# Patient Record
Sex: Male | Born: 1985 | Race: White | Hispanic: No | Marital: Single | Smoking: Current every day smoker
Health system: Southern US, Community
[De-identification: ages and names within clinical notes are randomized; demographics above are authoritative.]

## PROBLEM LIST (undated history)

## (undated) HISTORY — PX: ANKLE SURGERY: SHX546

## (undated) HISTORY — PX: PELVIC FRACTURE SURGERY: SHX119

---
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
## 2007-10-16 ENCOUNTER — Emergency Department (HOSPITAL_COMMUNITY): Admission: EM | Admit: 2007-10-16 | Discharge: 2007-10-16 | Payer: Self-pay | Admitting: Emergency Medicine

## 2007-12-25 ENCOUNTER — Emergency Department (HOSPITAL_COMMUNITY): Admission: EM | Admit: 2007-12-25 | Discharge: 2007-12-25 | Payer: Self-pay | Admitting: Emergency Medicine

## 2009-09-30 ENCOUNTER — Emergency Department (HOSPITAL_COMMUNITY): Admission: EM | Admit: 2009-09-30 | Discharge: 2009-09-30 | Payer: Self-pay | Admitting: Emergency Medicine

## 2010-04-01 ENCOUNTER — Ambulatory Visit: Payer: Self-pay | Admitting: Diagnostic Radiology

## 2010-04-01 ENCOUNTER — Emergency Department (HOSPITAL_BASED_OUTPATIENT_CLINIC_OR_DEPARTMENT_OTHER): Admission: EM | Admit: 2010-04-01 | Discharge: 2010-04-01 | Payer: Self-pay | Admitting: Emergency Medicine

## 2010-04-04 ENCOUNTER — Emergency Department (HOSPITAL_BASED_OUTPATIENT_CLINIC_OR_DEPARTMENT_OTHER): Admission: EM | Admit: 2010-04-04 | Discharge: 2010-04-04 | Payer: Self-pay | Admitting: Emergency Medicine

## 2010-10-12 ENCOUNTER — Emergency Department (HOSPITAL_BASED_OUTPATIENT_CLINIC_OR_DEPARTMENT_OTHER)
Admission: EM | Admit: 2010-10-12 | Discharge: 2010-10-12 | Disposition: A | Discharge status: Home / Self Care | Payer: Self-pay | Attending: Emergency Medicine | Admitting: Emergency Medicine

## 2010-10-12 ENCOUNTER — Emergency Department (INDEPENDENT_AMBULATORY_CARE_PROVIDER_SITE_OTHER): Payer: Self-pay

## 2010-10-12 DIAGNOSIS — S82899A Other fracture of unspecified lower leg, initial encounter for closed fracture: Secondary | ICD-10-CM

## 2010-10-12 DIAGNOSIS — F17200 Nicotine dependence, unspecified, uncomplicated: Secondary | ICD-10-CM | POA: Insufficient documentation

## 2010-10-12 DIAGNOSIS — X500XXA Overexertion from strenuous movement or load, initial encounter: Secondary | ICD-10-CM

## 2010-12-16 ENCOUNTER — Ambulatory Visit (HOSPITAL_COMMUNITY)
Admission: RE | Admit: 2010-12-16 | Discharge: 2010-12-16 | Disposition: A | Discharge status: Home / Self Care | Payer: Self-pay | Source: Ambulatory Visit | Attending: Specialist | Admitting: Specialist

## 2010-12-16 DIAGNOSIS — F17200 Nicotine dependence, unspecified, uncomplicated: Secondary | ICD-10-CM | POA: Insufficient documentation

## 2010-12-16 DIAGNOSIS — S8263XA Displaced fracture of lateral malleolus of unspecified fibula, initial encounter for closed fracture: Secondary | ICD-10-CM | POA: Insufficient documentation

## 2010-12-16 DIAGNOSIS — K219 Gastro-esophageal reflux disease without esophagitis: Secondary | ICD-10-CM | POA: Insufficient documentation

## 2010-12-16 DIAGNOSIS — Y92009 Unspecified place in unspecified non-institutional (private) residence as the place of occurrence of the external cause: Secondary | ICD-10-CM | POA: Insufficient documentation

## 2010-12-16 DIAGNOSIS — X58XXXA Exposure to other specified factors, initial encounter: Secondary | ICD-10-CM | POA: Insufficient documentation

## 2010-12-16 LAB — COMPREHENSIVE METABOLIC PANEL
ALT: 25 U/L (ref 0–53)
AST: 22 U/L (ref 0–37)
Albumin: 4.4 g/dL (ref 3.5–5.2)
Alkaline Phosphatase: 69 U/L (ref 39–117)
BUN: 8 mg/dL (ref 6–23)
Creatinine, Ser: 1.1 mg/dL (ref 0.4–1.5)
GFR calc Af Amer: >60 mL/min (ref >60)
GFR calc non Af Amer: >60 mL/min (ref >60)
Potassium: 4.6 mEq/L (ref 3.5–5.1)
Sodium: 136 mEq/L (ref 135–145)
Total Bilirubin: 0.6 mg/dL (ref 0.3–1.2)

## 2010-12-16 LAB — CBC
Hemoglobin: 17 g/dL (ref 13.0–17.0)
MCV: 92.2 fL (ref 78.0–100.0)
Platelets: 216 10*3/uL (ref 150–400)
RBC: 5.23 MIL/uL (ref 4.22–5.81)
RDW: 13.2 % (ref 11.5–15.5)
WBC: 8.7 10*3/uL (ref 4.0–10.5)

## 2011-01-07 NOTE — Op Note (Signed)
[name], [name]          ACCOUNT NO.:  [id number]  MEDICAL RECORD NO.:  [id number]           PATIENT TYPE:  O  LOCATION:  [facility]                         FACILITY:  [facility]  PHYSICIAN:  Kerrin Champagne, M.D.   DATE OF BIRTH:  October 04, 1985  DATE OF PROCEDURE:  12/16/2010 DATE OF DISCHARGE:  12/16/2010                              OPERATIVE REPORT   PREOPERATIVE DIAGNOSIS:  Right ankle supination external rotation fracture with oblique spiral fracture of the distal fibula at the level of the plafond with widening of the medial joint line after a period of 6-8 weeks postinjury.  POSTOPERATIVE DIAGNOSIS:  Right ankle supination external rotation fracture with oblique spiral fracture of the distal fibula at the level of the plafond with widening of the medial joint line after a period of 6-8 weeks postinjury.  PROCEDURE:  Open reduction and internal fixation of the right lateral malleolus fracture using a 3.5 Nephew trauma locking plate, single 3.5 lag screw from anterior proximal and distal posterior fragments.  SURGEON:  Kerrin Champagne, MD  ASSISTANT:  None.  ANESTHESIA:  General anesthetic performed by Dr. Jean Rosenthal, supplemented with local infiltration with Marcaine 0.5% plain.  ESTIMATED BLOOD LOSS:  Less than 50 mL.  DRAINS:  None.  TOTAL TOURNIQUET TIME:  At 300 mmHg, 1 hour and 19 minutes.  BRIEF CLINICAL HISTORY:  The patient is a 25 year old male who sustained injury to his right ankle on February 19 when he was walking in his yard and tripped in a hole, was seen in the emergency room.  X-rays demonstrated oblique spiral fracture of the right lateral malleolus at the level of the plafond with minimal widening of medial joint line.  He was placed in splint, seen in my office the next week where evaluation demonstrated the aforementioned radiographic findings, had local anesthetic and block of the fracture site and reduction in closed fashion.  He was placed into a  short-leg cast with the foot held in supination and internal rotation.  After a period of nearly 4 weeks, the patient was then placed into a walking-type brace.  Allowed to begin some partial weightbearing with follow up radiographs demonstrated good position and alignment of fracture site.  At 6 weeks out, he increased in his weightbearing status.  Radiographs demonstrated widening of the medial joint line.  The fracture involving the fibula still showing oblique spiral pattern with widening of the fracture area.  After discussion with the patient and his mother the risks versus benefits, leaving the fracture the way it was versus reduction were understood. He wishes to undergo operative reduction of the fracture site and internal fixation.  He understands risks and benefits regarding the surgery.  Signed informed consent.  DESCRIPTION OF PROCEDURE:  The patient was seen in the preop holding area and marking of the right ankle using an operating room marking pen an X in my initials.  He was then given preoperative antibiotics of Ancef.  Transported to the OR via stretcher.  OR room #15 used for this procedure at [facility].  He was transferred to the OR table. A sliding OR table was used, the patient  in a supine position. Following induction of general anesthesia, intubation was carried out uneventfully and atraumatically.  The patient then had prep of the right lower extremity from the upper thigh to the toes with DuraPrep solution. Tourniquet about the right upper thigh.  Draped in the usual manner of right lower extremity, bump under the right buttock.  All pressure points were well padded.  The patient had the initial time-out protocol carried out which the expected procedure, length of procedure, expected blood loss were discussed and standard protocol.  He then had elevation of the right lower extremity 45 degrees and tourniquet inflated to 300 mmHg after exsanguination  with Esmarch and flexed 100 degrees at this point.  The patient's table brought up slightly and the initial incision was made approximately 5-6 cm in length directly over the fracture site at the level of the plafond, extended distally and proximally.  Through skin and subcutaneous layers directly down to the superficial wall aspect of the lateral malleolus.  A 15-blade scalpel was used for the incision.  Incision of the periosteum was carried out.  Subperiosteal dissection of anterior and posterior preserving flaps here.  The fracture site identified using a Therapist, nutritional and a 15-blade scalpel used to incise the fracture site from the distal anterior to proximal posterior on the lateral aspect of the fibula, debriding the fracture site and callus that was formed and fibrous tissue present.   Dictation ended at this point.     Kerrin Champagne, M.D.     JEN/MEDQ  D:  12/17/2010  T:  12/18/2010  Job:  [id number]  Electronically Signed by Vira Browns M.D. on 01/07/2011 05:58:30 PM

## 2011-01-07 NOTE — Op Note (Signed)
Jon [name], Jon [name]          ACCOUNT NO.:  [id number]  MEDICAL RECORD NO.:  [id number]           PATIENT TYPE:  O  LOCATION:  [facility]                         FACILITY:  [facility]  PHYSICIAN:  Kerrin Champagne, M.D.   DATE OF BIRTH:  05-Dec-1985  DATE OF PROCEDURE:  12/16/2010 DATE OF DISCHARGE:  12/16/2010                              OPERATIVE REPORT   PREOPERATIVE DIAGNOSIS:  Right displaced lateral malleolus fracture with widening medial joint space (right ankle fracture SER III injury).  POSTOPERATIVE DIAGNOSIS:  Right displaced lateral malleolus fracture with widening medial joint space (right ankle fracture SER III injury).  PROCEDURE:  Open reduction and internal fixation of right lateral malleolus utilizing a DePuy Trauma 7-hole locking plate.  3.5 low- profile.  A single 3.5 locking screw from the anterior proximal to distal posterior portion of the lateral malleolus fracture site. Takedown of partially healed lateral malleolus fracture.  SURGEON:  Kerrin Champagne, MD  ASSISTANT:  None.  ANESTHESIA:  General via orotracheal intubation, Dr. Sheldon Silvan.  ESTIMATED BLOOD LOSS:  Less than 25 mL.  DRAINS:  None.  TOTAL TOURNIQUET TIME:  At 350 mmHg was 90 minutes.  BRIEF CLINICAL HISTORY:  This patient, a 25 year old male who sustained injury to his right ankle.  He was walking in his yard and his foot caught into a hole.  The injury occurred in February 2012.  He was seen in the office and treated conservatively after having first been seen in the emergency room at [facility].  The patient had initial local anesthetic and fracture block and reduction in the office and placement into a short-leg cast, nonweightbearing for a total of 6 weeks, then changed to a Cam walking type brace and begun on partial weightbearing at 6 weeks postinjury.  He, however, returned full weightbearing as tolerated.  X-rays were taken which demonstrated widening of the medial joint line and  lateral shift of the talus within the patient's ankle joint.  The fracture line appeared to be quite distinct and opened showing signs of delayed healing and signs of worsening alignment.  He is therefore scheduled to undergo a open reduction and internal fixation of the right lateral malleolus with failure of conservative management. The patient understands the risks and benefits of the procedure, agreed to undergo procedure, and has signed informed consent.  DESCRIPTION OF PROCEDURE:  This patient was seen in the preoperative holding area, had marking of the right lower extremity with an X and my initials on his toe and upper leg.  He was then transported to the OR. OR room #15 at [facility] was used for this procedure.  He had preoperative antibiotics of Ancef.  In the operating room, he was transferred to the OR bed.  A sliding radio operating table was used and a bump under the right buttock.  Right lower extremity was prepped with DuraPrep solution from the toes to the upper calf with DuraPrep solution.  He was draped in the usual manner.  Tourniquet was about the right upper thigh.  The patient then had elevation of his right lower extremity and exsanguination with Esmarch bandage.  Tourniquet  was inflated to 300 mmHg initially.  After a short period of time, this was elevated to 350.  The fracture was identified and an incision was then made, approximately 6 cm in length based right over the level of plafond, as this was the level of the fracture site.  Incision through skin and subcutaneous layers directly down to the subcutaneous portion of the fibula over the lateral malleolus.  The periosteum was then incised and subperiosteally dissection carried anterior to posterior. Fracture line identified and incised with a #15 blade scalpel.  Very anterior-inferior aspect of the fracture line was carefully freed up using a small curette.  The fracture was then further opened  by replicating the injury of supination and external rotation and further debridement was then carried out at the entire fracture site freeing it up.  After this was then freed up, fracture was able to be reduced after scar tissue was interposed and was removed.  Irrigation was carried out of the joint as well as the fracture line.  Fracture was then internally rotated and adducted and then lobster claw bone holding forceps were placed to reduce the fracture.  Soft tissue over the anterior aspect of the proximal fragment of the fibula fracture site then exposed using a metatarsal retractor.  A 3.5 drill bit was then used to perform drill hole over the anterior aspect of the proximal fragment about 2-1/2 cm proximally aligned with the fracture site and directed distally and posteriorly.  Next, the 2.5 drill bit was used to drill the deep cortex in line with the initial drill hole.  Using the 2.5 drill sleeve within the proximal drill hole, it was 3.5.  After this, this was measured for length and the appropriate sized screw was then placed obtaining initial fixation of the fracture site.  Fracture tended but still wished to spring open slightly so that the plate utilized had 4 distal locking screws and 3 proximal regular screw holes, it was a combination of locking plate and DCP.  This plate was carefully annealed against the lateral aspect of the fracture site.  No contouring was performed allowing this and then the spring to the fracture back into anatomic position alignment.  Three proximal drill holes were then carefully drilled and this was then used to carefully align the plate and to obtain springing of the distal holes.  The most proximal of the 3 holes was then filled with a single cortical screw and the 3 distal holes were filled with screws that were locking.  All screws were directed away from the joint itself, directed more towards the syndesmotic area.  Care was taken to  ensure appropriate screw length was used at each level. Intraoperative evaluation using a mini C-arm, indicating that this was used to carefully evaluate the fracture site and the patient's medial joint line was noted to reduce and all the fracture screws appeared to be in good position alignment.  The lag screw appeared to be slightly long, so this was changed to a shorter screw length obtaining excellent purchase.  With this, then irrigation was carried out and the patient had closure by approximating the periosteum over the fracture site with interrupted 0-Vicryl sutures, subcu layers approximated with interrupted 2-0 Vicryl suture, and the skin closed with stainless steel staples. Skin and subcu layers were infiltrated with Marcaine and the joint injected with 5 mL of Marcaine and 0.5% plain.  The patient then had an Adaptic, 4 x 4s, ABD pads fixed to the  skin with sterile Webril and then a well-padded posterior short-leg cast was applied.  Tourniquet was released.  Total tourniquet time was 90 minutes secondary to takedown of the partially healed fibula fracture site prior to fixation.  The patient was reactivated and returned to the recovery room in satisfactory condition.  Note that all instrument and sponge counts were correct.  POSTOPERATIVE CARE:  The patient was treated as an outpatient, underwent the ORIF as an outpatient at [facility].  Therefore was discharged home using crutches, nonweightbearing, right lower extremity. He is to take Norco for discomfort 7.5 mg tablets, 1-2 every 4-6 hours p.r.n. pain, aspirin full strength to be taken twice a day to prevent DVT.  He will be seen back in the office in 2 weeks for a followup visit.    Kerrin Champagne, M.D.    JEN/MEDQ  D:  12/19/2010  T:  12/20/2010  Job:  [id number]  Electronically Signed by Vira Browns M.D. on 01/07/2011 05:58:44 PM
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
## 2011-01-09 NOTE — Consult Note (Signed)
[name], [name] NO.:  [id number]   MEDICAL RECORD NO.:  [id number]          PATIENT TYPE:  EMS   LOCATION:  ED                           FACILITY:  [facility]   PHYSICIAN:  Vanita Panda. Magnus Ivan, M.D.DATE OF BIRTH:  July 20, 1986   DATE OF CONSULTATION:  12/05/2004  DATE OF DISCHARGE:                                   CONSULTATION   REASON FOR CONSULTATION:  Left ankle fracture.   HISTORY OF PRESENT ILLNESS:  Briefly, Jon [name] is a 25 year old male who was  wrestling in his yard this evening when he sustained injury to his right  ankle. He was brought by a friend to the [facility] Emergency Room.  X-rays  were obtained and he was found to have a trimalleolar ankle fracture.  This  was a closed fracture.  Orthopedic surgical services was consulted.  He  denies any other injuries and denies numbness and tingling in his foot.   PAST MEDICAL HISTORY:  Negative.   ALLERGIES:  No known drug allergies.   MEDICATIONS:  None.   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  He does report smoking half a pack a day and drinking alcohol  as well.   REVIEW OF SYSTEMS:  Negative for chest pain, shortness of breath, nausea and  vomiting, fever or chills.   PHYSICAL EXAMINATION:  VITAL SIGNS:  he is afebrile.  Vital signs are  stable.  GENERAL:  He is alert and oriented, in no acute distress, in minimal  discomfort.  EXTREMITIES:  Examination of his left ankle shows moderate swelling over the  fibula and the medial malleolus.  Ankle clinically appears to be located.  He moves his toes easily and has normal sensation over his foot.  The skin  is intact.  He has palpable dorsalis pedis and posterior tibial pulses.   LABORATORY DATA:  X-rays were obtained and are significant for a  trimalleolar ankle fracture.  The medial malleolus is displaced.  Talus is  slightly subluxed laterally.  The fibula is a Weber B fibula fracture.  The  lateral view shows the ankle to be well located and there is a small piece  of the posterior malleolus but this is less than 5% of the joint surface.   ASSESSMENT:  This is a 25 year old male with a trimalleolar ankle fracture.   PLAN:  Gentle reduction maneuver was performed in the ER and the ankle was  placed in a well-padded posterior splint with a stirrup.  __________  molding was applied as well.  The patient was given instructions for crutch  training and nonweightbearing.  His mother was with him and listened to  these instructions as well, and again, I want him to have strict adherence  to nonweightbearing on this ankle.  I explained the need for getting him set  up for  surgery next week for plating the fibula as well as a lag screw in the  fibula and then screws in the medial malleolus.  The risks and benefits were  explained to the family.  Appropriate numbers were given for following up in  the clinic early next week for getting surgery scheduled.      CYB/MEDQ  D:  12/05/2004  T:  12/05/2004  Job:  [id number]

## 2011-01-09 NOTE — Op Note (Signed)
Jon [name]               ACCOUNT NO.:  [id number]   MEDICAL RECORD NO.:  [id number]          PATIENT TYPE:  OIB   LOCATION:  NA                           FACILITY:  [facility]   PHYSICIAN:  Jayro Mcmath. Magnus Ivan, M.D.DATE OF BIRTH:  02/16/1986   DATE OF PROCEDURE:  12/12/2004  DATE OF DISCHARGE:                                 OPERATIVE REPORT   PREOPERATIVE DIAGNOSIS:  Left ankle trimalleolar ankle fracture.   POSTOPERATIVE DIAGNOSIS:  Left ankle trimalleolar ankle fracture.   PROCEDURE:  1.  Open reduction internal fixation of left ankle distal fibula fracture      (Weber B).  2.  Open reduction internal fixation of left ankle medial malleolus      fracture.   SURGEON:  Jaxxson Cavanah. Magnus Ivan, M.D.   ANESTHESIA:  General.   TOURNIQUET TIME:  1 hour and 39 minutes.   BLOOD LOSS:  Minimal.   COMPLICATIONS:  None.   INDICATIONS:  Jon [name] is a 25 year old who a week ago was wrestling in the  yard with a bunch of his friends.  He sustained an injury to his left ankle  and was seen in the emergency room and found to have significant swelling of  the ankle.  X-rays were obtained and he was found to have a trimalleolar  ankle fracture of the left ankle.  This was a Weber B distal fibula fracture  and a displaced medial malleolar fracture with slight subluxation of the  ankle mortise as well as a small posterior malleolar fracture.  The ankle  subluxation was reduced and he was placed in a well padded splint, given  instructions for elevation and ice and the surgery was scheduled for this  week and he now presents for this.   PROCEDURE DESCRIPTION:  After informed consent was obtained from him and his  parents, he was taken to the operating room and placed supine on the  operating table.  Before being brought back, an attempt at an ankle block  was undertaken.  During prepping of his ankle he did feel that there was  some added pain to his ankle, so general anesthesia  was induced with LMA.  A  non sterile tourniquet was placed around his upper thigh and then his ankle  was prepped from the knee down with DuraPrep and sterile drapes.  The ankle  was wrapped out with an Esmarch and then the tourniquet was inflated to 300  mm of pressure.  Attention was first turned to the lateral aspect of the  ankle and an incision was made longitudinally over the distal fibula right  over the site of the fracture, this was extended proximally and distally.  The incision was carried down to the bone, the fracture was exposed.  He was  found to have an oblique fracture at the level of the syndesmosis.  The  fracture was cleaned of any debris as well as hematoma and irrigation was  applied.  Using reduction forceps, the ankle was then put into reduced  position.  Using a lag screw technique, a lag screw was carried from  anterior to posterior, traversing the fracture site, and a 3.5 mm screw was  placed.  The reduction forceps was removed and the ankle was assessed under  fluoroscopy.  Next, a 6 hole one-third semi-tubular plate was then fashioned  as a buttress along the lateral aspect of the distal fibula which was  secured with three bicortical screws proximally and two count 4.0 cancellous  screws distally.  Once the stability of this fracture site was noted,  attention was turned to the medial malleolus.  __________ incision was made  over the medial malleolus on the medial side of the ankle.  This was carried  down meticulously to the fracture site and the fracture again was cleaned of  its debris.  It was then held in a reduced position and two guide pins were  placed from the tip of the medial malleolus, crossing the fracture into the  metaphyseal portion of the distal tibia.  The fracture was held in a reduced  position and then each of these guide wires was drilled with a 2.0 drill in  a cannulated fashion.  Two partially threaded cannulated 4.0 screws were  then  placed through the medial __________ guide wires were removed.  The  ankle was assessed and the posterior malleolar fracture was found to be in a  reduced position in less than 10% of the overall joint area.  It was felt to  treat this conservatively with just the casting.  The wounds were then all  copiously irrigated and the deep tissue was closed with interrupted 0 Vicryl  suture over the hardware followed by 2-0 Vicryl in the subcutaneous tissue  and 2-0 nylons in the skin.  The ankle was cleaned and then sterile  dressings were applied followed by a well padded Robert-Jones type splint.  The tourniquet was then let down, the toes were pinkened nicely.  The  patient was awakened, extubated and taken to the recovery room in stable  condition.      CYB/MEDQ  D:  12/14/2004  T:  12/14/2004  Job:  [id number]
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
## 2011-05-15 LAB — URINALYSIS, ROUTINE W REFLEX MICROSCOPIC
Glucose, UA: NEGATIVE
Ketones, ur: NEGATIVE
Nitrite: NEGATIVE
Specific Gravity, Urine: 1.026

## 2012-02-22 ENCOUNTER — Encounter (HOSPITAL_BASED_OUTPATIENT_CLINIC_OR_DEPARTMENT_OTHER): Payer: Self-pay | Admitting: Family Medicine

## 2012-02-22 ENCOUNTER — Emergency Department (HOSPITAL_BASED_OUTPATIENT_CLINIC_OR_DEPARTMENT_OTHER)
Admission: EM | Admit: 2012-02-22 | Discharge: 2012-02-22 | Disposition: A | Discharge status: Home / Self Care | Payer: Self-pay | Attending: Emergency Medicine | Admitting: Emergency Medicine

## 2012-02-22 DIAGNOSIS — K047 Periapical abscess without sinus: Secondary | ICD-10-CM | POA: Insufficient documentation

## 2012-02-22 DIAGNOSIS — R220 Localized swelling, mass and lump, head: Secondary | ICD-10-CM | POA: Insufficient documentation

## 2012-02-22 MED ORDER — CLINDAMYCIN HCL 150 MG PO CAPS: 150.0000 mg | ORAL_CAPSULE | Freq: Three times a day (TID) | ORAL | Status: AC

## 2012-02-22 MED ORDER — HYDROCODONE-ACETAMINOPHEN 5-500 MG PO TABS: 1.0000 | ORAL_TABLET | Freq: Four times a day (QID) | ORAL | Status: AC | PRN

## 2012-02-22 NOTE — ED Provider Notes (Signed)
Medical screening examination/treatment/procedure(s) were performed by non-physician practitioner and as supervising physician I was immediately available for consultation/collaboration.   Sofiya Ezelle B. Bernette Mayers, MD 02/22/12 [phone]

## 2012-02-22 NOTE — ED Notes (Signed)
Pt c/o left facial swelling since yesterday and sinus pain.

## 2012-02-22 NOTE — Discharge Instructions (Signed)
Dental Abscess A dental abscess usually starts from an infected tooth. Antibiotic medicine and pain pills can be helpful, but dental infections require the attention of a dentist. Rinse around the infected area often with salt water (a pinch of salt in 8 oz of warm water). Do not apply heat to the outside of your face. See your dentist or oral surgeon as soon as possible.  SEEK IMMEDIATE MEDICAL CARE IF:  You have increasing, severe pain that is not relieved by medicine.   You or your child has an oral temperature above 102 F (38.9 C), not controlled by medicine.   Your baby is older than 3 months with a rectal temperature of 102 F (38.9 C) or higher.   Your baby is 3 months old or younger with a rectal temperature of 100.4 F (38 C) or higher.   You develop chills, severe headache, difficulty breathing, or trouble swallowing.   You have swelling in the neck or around the eye.  Document Released: 08/10/2005 Document Revised: 07/30/2011 Document Reviewed: 01/19/2007 [employer] Patient Information 2012 [employer]. 

## 2012-02-22 NOTE — ED Provider Notes (Signed)
History     CSN: [id number]  Arrival date & time 02/22/12  1232   First MD Initiated Contact with Patient 02/22/12 1247      Chief Complaint  Patient presents with  . Facial Swelling    (Consider location/radiation/quality/duration/timing/severity/associated sxs/prior treatment) HPI Comments: Pt c/o left sided facial pain and swelling that started yesterday:pt denies fever:pt denies any known dental problem although hasn't been to the dentist in 10 years:pt denies history of similar symptoms  The history is provided by the patient. No language interpreter was used.    History reviewed. No pertinent past medical history.  Past Surgical History  Procedure Date  . Ankle surgery     No family history on file.  History  Substance Use Topics  . Smoking status: Current Everyday Smoker  . Smokeless tobacco: Not on file  . Alcohol Use: Yes      Review of Systems  Constitutional: Negative.   Respiratory: Negative.   Cardiovascular: Negative.   Neurological: Negative.     Allergies  Review of patient's allergies indicates no known allergies.  Home Medications   Current Outpatient Rx  Name Route Sig Dispense Refill  . CLINDAMYCIN HCL 150 MG PO CAPS Oral Take 1 capsule (150 mg total) by mouth 3 (three) times daily. 21 capsule 0  . HYDROCODONE-ACETAMINOPHEN 5-500 MG PO TABS Oral Take 1-2 tablets by mouth every 6 (six) hours as needed for pain. 10 tablet 0    BP 154/93  Pulse 104  Temp 98.6 F (37 C) (Oral)  Resp 16  Ht 5\' 11"  (1.803 m)  Wt 235 lb (106.595 kg)  BMI 32.78 kg/m2  SpO2 100%  Physical Exam  Nursing note and vitals reviewed. Constitutional: He appears well-developed and well-nourished.  HENT:  Right Ear: External ear normal.  Left Ear: External ear normal.       Pt has swelling to left cheek no definitive source not in the dentition  Cardiovascular: Normal rate and regular rhythm.   Pulmonary/Chest: Effort normal and breath sounds normal.    ED  Course  Procedures (including critical care time)  Labs Reviewed - No data to display No results found.   1. Dental abscess       MDM  Will treat for abscess:pt referred to dentist        Teressa Lower, NP 02/22/12 1320

## 2012-03-28 ENCOUNTER — Encounter (HOSPITAL_BASED_OUTPATIENT_CLINIC_OR_DEPARTMENT_OTHER): Payer: Self-pay | Admitting: *Deleted

## 2012-03-28 ENCOUNTER — Emergency Department (HOSPITAL_BASED_OUTPATIENT_CLINIC_OR_DEPARTMENT_OTHER): Payer: Self-pay

## 2012-03-28 ENCOUNTER — Emergency Department (HOSPITAL_BASED_OUTPATIENT_CLINIC_OR_DEPARTMENT_OTHER)
Admission: EM | Admit: 2012-03-28 | Discharge: 2012-03-28 | Disposition: A | Discharge status: Home / Self Care | Payer: Self-pay | Attending: Emergency Medicine | Admitting: Emergency Medicine

## 2012-03-28 DIAGNOSIS — S8990XA Unspecified injury of unspecified lower leg, initial encounter: Secondary | ICD-10-CM

## 2012-03-28 DIAGNOSIS — F17200 Nicotine dependence, unspecified, uncomplicated: Secondary | ICD-10-CM | POA: Insufficient documentation

## 2012-03-28 DIAGNOSIS — M25469 Effusion, unspecified knee: Secondary | ICD-10-CM | POA: Insufficient documentation

## 2012-03-28 DIAGNOSIS — W19XXXA Unspecified fall, initial encounter: Secondary | ICD-10-CM | POA: Insufficient documentation

## 2012-03-28 DIAGNOSIS — M25569 Pain in unspecified knee: Secondary | ICD-10-CM | POA: Insufficient documentation

## 2012-03-28 MED ORDER — OXYCODONE-ACETAMINOPHEN 5-325 MG PO TABS: 1.0000 | ORAL_TABLET | Freq: Four times a day (QID) | ORAL | Status: AC | PRN

## 2012-03-28 MED ORDER — IOHEXOL 350 MG/ML SOLN
120.0000 mL | Freq: Once | INTRAVENOUS | Status: AC | PRN
  Administered 2012-03-28: 120 mL via INTRAVENOUS

## 2012-03-28 MED ORDER — OXYCODONE-ACETAMINOPHEN 5-325 MG PO TABS
1.0000 | ORAL_TABLET | Freq: Once | ORAL | Status: AC
Start: 2012-03-28 — End: 2012-03-28
  Administered 2012-03-28: 1 via ORAL
  Filled 2012-03-28 (×2): qty 1

## 2012-03-28 NOTE — ED Provider Notes (Signed)
History  This chart was scribed for Jon Chick, MD by Erskine Emery. This patient was seen in room [facility] and the patient's care was started at 15:38.   CSN: [id number]  Arrival date & time 03/28/12  1354   First MD Initiated Contact with Patient 03/28/12 1538      Chief Complaint  Patient presents with  . Knee Pain    (Consider location/radiation/quality/duration/timing/severity/associated sxs/prior treatment) HPI Jon [name] is a 26 y.o. male who presents to the Emergency Department complaining of moderate left knee pain, aggravated by putting weight on the knee since this morning. Pt reports he stepped in a hole and fell backwards onto his back and buttocks yesterday. Pt has been ambulatory since and denies any head injury or LOC from the fall. Pt reports a previous injury to the knee but never this severe. Pt tried icing the knee with only mild relief from symptoms. Pt has no known allergies. Pain is worse with movement and palpation.  There are no other associated systemic symptoms, there are no other alleviating or modifying factors.   History reviewed. No pertinent past medical history.  Past Surgical History  Procedure Date  . Ankle surgery     No family history on file.  History  Substance Use Topics  . Smoking status: Current Everyday Smoker  . Smokeless tobacco: Not on file  . Alcohol Use: Yes      Review of Systems  Constitutional: Negative for fever and chills.  Respiratory: Negative for shortness of breath.   Gastrointestinal: Negative for nausea and vomiting.  Musculoskeletal:       Left knee pain  Neurological: Negative for syncope and weakness.      Allergies  Review of patient's allergies indicates no known allergies.  Home Medications   Current Outpatient Rx  Name Route Sig Dispense Refill  . OXYCODONE-ACETAMINOPHEN 5-325 MG PO TABS Oral Take 1-2 tablets by mouth every 6 (six) hours as needed for pain. 20 tablet 0    Triage  Vitals: BP 137/95  Pulse 93  Temp 98.2 F (36.8 C) (Oral)  Resp 20  SpO2 100%  Physical Exam  Nursing note and vitals reviewed. Constitutional: He is oriented to person, place, and time. He appears well-developed.  HENT:  Head: Normocephalic and atraumatic.  Eyes: Conjunctivae are normal.  Neck: No tracheal deviation present.  Cardiovascular:  No murmur heard. Pulmonary/Chest: Effort normal.  Musculoskeletal: Normal range of motion.       Mild swelling in the left knee with tenderness to palpation in the medial and lateral joint line.  Neurological: He is oriented to person, place, and time.  Skin: Skin is warm.  Psychiatric: He has a normal mood and affect.  note- lower extremity is distally NVI  ED Course  Procedures (including critical care time) DIAGNOSTIC STUDIES: Oxygen Saturation is 100% on room air, normal by my interpretation.    COORDINATION OF CARE: 15:50--I evaluated the patient and we discussed a treatment plan including CT scan and IV medications to which the pt agreed. I informed the pt of the results of his x-ray.  16:00--Medication order: Oxycodone-acetaminophen (Percocet/Roxicet) 5-325 mg per tablet, 1 tablet--once   Labs Reviewed - No data to display Ct Angio Low Extrem Left W/cm &/or Wo/cm  03/28/2012  *RADIOLOGY REPORT*  Clinical Data:  Left knee pain, and swelling after stepping in a hole and twisting his knee.  Evaluate for popliteal artery injury  CT ANGIOGRAPHY OF LEFT LOWER EXTREMITY  Technique:  Multidetector  CT imaging of the abdomen, pelvis and lower extremities was performed using the standard protocol during bolus administration of intravenous contrast.  Multiplanar CT image reconstructions including MIPs were obtained to evaluate the vascular anatomy.  Contrast: OMNIPAQUE IOHEXOL 350 MG/ML SOLN  Comparison:   Conventional radiographs obtained earlier today, 03/28/2012 at 14:28.  Findings:  No acute arterial injury identified.  Specifically,  the popliteal artery is widely patent and normal in contour.  No evidence of arterial rupture, dissection, or pseudoaneurysm.  No significant atherosclerotic vascular disease.  The bilateral iliac, femoral and popliteal arteries are free of disease.  The bilateral anterior tibial artery, tibioperoneal trunk peroneal and posterior tibial arteries are also widely patent. There is three-vessel runoff to the ankles.  Imaging of the abdominal aorta demonstrates no aneurysm, or dissection. There are bilateral accessory renal arteries supplying the lower poles.  On the right, the accessory renal artery comes off lobe, just above the aortic bifurcation.  The celiac axis, superior mesenteric artery, renal arteries and inferior mesenteric artery are unremarkable.  Review of the MIP images confirms the above findings.  Nonvascular findings:  No acute fracture, or malalignment identified.  There is a moderate left knee joint effusion.  Prior ORIF of a now healed left bimalleolar fracture with medial malleolar lag screws, and lateral buttress plate and screw fixation with a single inter fragmentary screw noted without evidence of hardware complication. Healed right distal fibular fracture status post ORIF with buttress plate and screw construct in similar fragmentary screw.  No evidence of hardware complication.  The visualized abdominal organs are unremarkable.  Normal-caliber large small bowel throughout.  Normal appendix in the right lower quadrant.  No free fluid.  IMPRESSION:  1.  No evidence of acute injury to the left popliteal artery.  2.  Moderate left knee joint effusion.  No evidence of underlying fracture. If there is clinical concern for internal derangement, MRI could further evaluate.  3.  Healed left ankle bi-malleolar fracture, and healed right lateral malleolar fractures status post ORIF as described above. No evidence of hardware complication.  Original Report Authenticated By: Alvino Blood Knee Complete 4  Views Left  03/28/2012  *RADIOLOGY REPORT*  Clinical Data: Knee pain.  LEFT KNEE - COMPLETE 4+ VIEW  Comparison: 04/01/2010.  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  Large effusion.  No fracture.  IMPRESSION: Large effusion without acute osseous abnormality.  Question internal derangement.  Original Report Authenticated By: Andreas Newport, M.D.     1. Knee injury       MDM  Pt presenting with pain and swelling in left knee after hyperextension injury.  CT angio of knee obtained to r/o popliteal injury.  Pt placed in knee immobilizer and given crutches as well as pain meds.  Given referral info for follow up with orthpedics.  Discharged with strict return precautions.  Pt agreeable with plan.      I personally performed the services described in this documentation, which was scribed in my presence. The recorded information has been reviewed and considered.    Jon Chick, MD 03/28/12 [phone]

## 2012-03-28 NOTE — ED Notes (Signed)
Stepped in a hole last night and thinks he may have hyperextended his left knee. Painful this am. Ambulatory.

## 2012-06-14 ENCOUNTER — Encounter (HOSPITAL_COMMUNITY): Payer: Self-pay | Admitting: *Deleted

## 2012-06-14 ENCOUNTER — Emergency Department (HOSPITAL_COMMUNITY)
Admission: EM | Admit: 2012-06-14 | Discharge: 2012-06-14 | Disposition: A | Discharge status: Home / Self Care | Payer: Self-pay | Attending: Emergency Medicine | Admitting: Emergency Medicine

## 2012-06-14 DIAGNOSIS — F17200 Nicotine dependence, unspecified, uncomplicated: Secondary | ICD-10-CM | POA: Insufficient documentation

## 2012-06-14 DIAGNOSIS — M549 Dorsalgia, unspecified: Secondary | ICD-10-CM | POA: Insufficient documentation

## 2012-06-14 LAB — URINALYSIS, ROUTINE W REFLEX MICROSCOPIC
Bilirubin Urine: NEGATIVE
Glucose, UA: NEGATIVE mg/dL
Hgb urine dipstick: NEGATIVE
Ketones, ur: NEGATIVE mg/dL
Protein, ur: NEGATIVE mg/dL
pH: 7.5 (ref 5.0–8.0)

## 2012-06-14 LAB — URINE MICROSCOPIC-ADD ON

## 2012-06-14 MED ORDER — OXYCODONE-ACETAMINOPHEN 7.5-325 MG PO TABS: 1.0000 | ORAL_TABLET | ORAL | Status: DC | PRN

## 2012-06-14 NOTE — ED Notes (Signed)
Pt states the pain in on the right flank area and he woke up with it.

## 2012-06-14 NOTE — ED Notes (Signed)
Pt is here with lower back pain since yesterday and increasing with movement.  No urinary or gi symptoms

## 2012-06-16 NOTE — ED Provider Notes (Signed)
History     CSN: [id number]  Arrival date & time 06/14/12  1035   First MD Initiated Contact with Patient 06/14/12 1358      Chief Complaint  Patient presents with  . Back Pain    HPI Pt states the pain in on the right flank area and he woke up with it.  History reviewed. No pertinent past medical history.  Past Surgical History  Procedure Date  . Ankle surgery     No family history on file.  History  Substance Use Topics  . Smoking status: Current Every Day Smoker  . Smokeless tobacco: Not on file  . Alcohol Use: Yes      Review of Systems  All other systems reviewed and are negative.    Allergies  Review of patient's allergies indicates no known allergies.  Home Medications   Current Outpatient Rx  Name Route Sig Dispense Refill  . OXYCODONE-ACETAMINOPHEN 7.5-325 MG PO TABS Oral Take 1 tablet by mouth every 4 (four) hours as needed for pain. 15 tablet 0    BP 159/98  Pulse 89  Temp 98.6 F (37 C) (Oral)  Resp 16  SpO2 97%  Physical Exam  Nursing note and vitals reviewed. Constitutional: He is oriented to person, place, and time. He appears well-developed and well-nourished. No distress.  HENT:  Head: Normocephalic and atraumatic.  Eyes: Pupils are equal, round, and reactive to light.  Neck: Normal range of motion.  Cardiovascular: Normal rate and intact distal pulses.   Pulmonary/Chest: No respiratory distress.  Abdominal: Normal appearance. He exhibits no distension.  Musculoskeletal:       Arms: Neurological: He is alert and oriented to person, place, and time. No cranial nerve deficit.  Skin: Skin is warm and dry. No rash noted.  Psychiatric: He has a normal mood and affect. His behavior is normal.    ED Course  Procedures (including critical care time)  Labs Reviewed  URINALYSIS, ROUTINE W REFLEX MICROSCOPIC - Abnormal; Notable for the following:    Leukocytes, UA TRACE (*)     All other components within normal limits  URINE  MICROSCOPIC-ADD ON  LAB REPORT - SCANNED   No results found.   1. Back pain       MDM          Nelia Shi, MD 06/16/12 [phone]

## 2013-01-10 ENCOUNTER — Emergency Department (HOSPITAL_BASED_OUTPATIENT_CLINIC_OR_DEPARTMENT_OTHER)
Admission: EM | Admit: 2013-01-10 | Discharge: 2013-01-10 | Disposition: A | Discharge status: Home / Self Care | Payer: Worker's Compensation | Attending: Emergency Medicine | Admitting: Emergency Medicine

## 2013-01-10 ENCOUNTER — Encounter (HOSPITAL_BASED_OUTPATIENT_CLINIC_OR_DEPARTMENT_OTHER): Payer: Self-pay | Admitting: Family Medicine

## 2013-01-10 ENCOUNTER — Emergency Department (HOSPITAL_BASED_OUTPATIENT_CLINIC_OR_DEPARTMENT_OTHER): Payer: Worker's Compensation

## 2013-01-10 DIAGNOSIS — W268XXA Contact with other sharp object(s), not elsewhere classified, initial encounter: Secondary | ICD-10-CM | POA: Insufficient documentation

## 2013-01-10 DIAGNOSIS — S61209A Unspecified open wound of unspecified finger without damage to nail, initial encounter: Secondary | ICD-10-CM | POA: Insufficient documentation

## 2013-01-10 DIAGNOSIS — Y9289 Other specified places as the place of occurrence of the external cause: Secondary | ICD-10-CM | POA: Insufficient documentation

## 2013-01-10 DIAGNOSIS — S61311A Laceration without foreign body of left index finger with damage to nail, initial encounter: Secondary | ICD-10-CM

## 2013-01-10 DIAGNOSIS — Y9389 Activity, other specified: Secondary | ICD-10-CM | POA: Insufficient documentation

## 2013-01-10 DIAGNOSIS — F17200 Nicotine dependence, unspecified, uncomplicated: Secondary | ICD-10-CM | POA: Insufficient documentation

## 2013-01-10 MED ORDER — OXYCODONE-ACETAMINOPHEN 5-325 MG PO TABS: 2.0000 | ORAL_TABLET | ORAL | Status: DC | PRN

## 2013-01-10 MED ORDER — SULFAMETHOXAZOLE-TRIMETHOPRIM 800-160 MG PO TABS: 1.0000 | ORAL_TABLET | Freq: Two times a day (BID) | ORAL | Status: DC

## 2013-01-10 MED ORDER — HYDROCODONE-ACETAMINOPHEN 5-325 MG PO TABS
2.0000 | ORAL_TABLET | Freq: Once | ORAL | Status: AC
  Administered 2013-01-10: 2 via ORAL
  Filled 2013-01-10: qty 2

## 2013-01-10 MED ORDER — BUPIVACAINE HCL 0.25 % IJ SOLN
INTRAMUSCULAR | Status: AC
  Filled 2013-01-10: qty 1

## 2013-01-10 NOTE — ED Notes (Signed)
Pt has laceration to middle finger on left hand from a metal lawnmower pulley,. bleeding currently controlled with pressure dressing applied on scene.

## 2013-01-10 NOTE — ED Provider Notes (Signed)
Medical screening examination/treatment/procedure(s) were performed by non-physician practitioner and as supervising physician I was immediately available for consultation/collaboration.   Madilynn Montante, MD 01/10/13 1539 

## 2013-01-10 NOTE — ED Provider Notes (Signed)
History     CSN: [id number]  Arrival date & time 01/10/13  1204   First MD Initiated Contact with Patient 01/10/13 1222      Chief Complaint  Patient presents with  . Finger Injury    (Consider location/radiation/quality/duration/timing/severity/associated sxs/prior treatment) Patient is a 27 y.o. male presenting with hand pain. The history is provided by the patient. No language interpreter was used.  Hand Pain This is a new problem. The current episode started 1 to 4 weeks ago. The problem occurs constantly. The problem has been gradually worsening. Associated symptoms include joint swelling. Nothing aggravates the symptoms. He has tried nothing for the symptoms.  Pt cut finger over a piece on a lawnmower  History reviewed. No pertinent past medical history.  Past Surgical History  Procedure Laterality Date  . Ankle surgery      No family history on file.  History  Substance Use Topics  . Smoking status: Current Every Day Smoker  . Smokeless tobacco: Not on file  . Alcohol Use: Yes      Review of Systems  Musculoskeletal: Positive for joint swelling.  Skin: Positive for wound.  All other systems reviewed and are negative.    Allergies  Review of patient's allergies indicates no known allergies.  Home Medications   Current Outpatient Rx  Name  Route  Sig  Dispense  Refill  . oxyCODONE-acetaminophen (PERCOCET) 7.5-325 MG per tablet   Oral   Take 1 tablet by mouth every 4 (four) hours as needed for pain.   15 tablet   0     BP 151/103  Pulse 102  Temp(Src) 98.4 F (36.9 C) (Oral)  Resp 18  Ht 5\' 11"  (1.803 m)  Wt 235 lb (106.595 kg)  BMI 32.79 kg/m2  SpO2 100%  Physical Exam  Nursing note and vitals reviewed. Constitutional: He is oriented to person, place, and time. He appears well-developed and well-nourished.  HENT:  Head: Normocephalic.  Musculoskeletal: He exhibits tenderness.  1cm laceration dorsal aspect at dip crease,  Avulsed distal  finger tip,   Neurological: He is alert and oriented to person, place, and time. He has normal reflexes.  Skin: Skin is warm.  Psychiatric: He has a normal mood and affect.    ED Course  LACERATION REPAIR Date/Time: 01/10/2013 1:49 PM Performed by: Cheron Schaumann K Authorized by: Cheron Schaumann K Risks and benefits: risks, benefits and alternatives were discussed Patient understanding: patient does not state understanding of the procedure being performed Required items: required blood products, implants, devices, and special equipment available Patient identity confirmed: verbally with patient Time out: Immediately prior to procedure a "time out" was called to verify the correct patient, procedure, equipment, support staff and site/side marked as required. Tendon involvement: complex Vascular damage: no Anesthesia: local infiltration Local anesthetic: bupivacaine 0.25% without epinephrine Patient sedated: no Debridement: none Degree of undermining: none Skin closure: 5-0 nylon Number of sutures: 3 Technique: simple Approximation: loose Approximation difficulty: simple   (including critical care time)  Labs Reviewed - No data to display No results found.   No diagnosis found.    MDM  Pt has decreased extension.    I called Dr. Janee Morn to have pt seen in office for follow up.  He advised to have pt call for an appointment on Thursday for wound check and to schedule tendon repair       Elson Areas, PA-C 01/10/13 1358  Lonia Skinner [place], [place] 01/10/13 1402

## 2013-01-12 NOTE — ED Notes (Signed)
Received call from Mrs. Gayla Doss, stating the Dr. Carollee Massed office will not see the pt.  Call placed to Dr. Carollee Massed office, spoke with Chauncey Fischer.  Was told that the pt can be seen under [facility] and that they had called and been forwarded to [employer] for Dr. Janee Morn.   Stated that they are waiting to hear from [employer] and will contact the patient asap.  Recalled to Mrs. Erhart with above info.  Encouraged to follow up with PCP or return here if unable to be seen within the next 2 days.

## 2013-01-13 ENCOUNTER — Encounter (HOSPITAL_BASED_OUTPATIENT_CLINIC_OR_DEPARTMENT_OTHER): Payer: Self-pay | Admitting: *Deleted

## 2013-01-13 ENCOUNTER — Emergency Department (HOSPITAL_BASED_OUTPATIENT_CLINIC_OR_DEPARTMENT_OTHER)
Admission: EM | Admit: 2013-01-13 | Discharge: 2013-01-13 | Disposition: A | Discharge status: Home / Self Care | Payer: Worker's Compensation | Attending: Emergency Medicine | Admitting: Emergency Medicine

## 2013-01-13 DIAGNOSIS — F17200 Nicotine dependence, unspecified, uncomplicated: Secondary | ICD-10-CM | POA: Insufficient documentation

## 2013-01-13 DIAGNOSIS — R5383 Other fatigue: Secondary | ICD-10-CM | POA: Insufficient documentation

## 2013-01-13 DIAGNOSIS — R5381 Other malaise: Secondary | ICD-10-CM | POA: Insufficient documentation

## 2013-01-13 DIAGNOSIS — Z139 Encounter for screening, unspecified: Secondary | ICD-10-CM

## 2013-01-13 DIAGNOSIS — Z4801 Encounter for change or removal of surgical wound dressing: Secondary | ICD-10-CM | POA: Insufficient documentation

## 2013-01-13 NOTE — ED Provider Notes (Signed)
History     CSN: [id number]  Arrival date & time 01/13/13  1244   First MD Initiated Contact with Patient 01/13/13 1321      Chief Complaint  Patient presents with  . Wound Check    (Consider location/radiation/quality/duration/timing/severity/associated sxs/prior treatment) HPI Comments: Pt was seen 3 days ago after getting his left hand, middle finger caught in a belt.  He was seen at [facility], had the laceration stitched, wound care provided and due to concern for a tendon injury, called and discussed with Dr. Janee Morn on call for hand surgery, and was told he could be seen in the office on Thursday.  Pt called that Tuesday initially, and once more and was told by desk clerk that he couldn't be seen until workman's comp authorized this work even though he was given an appt and time on Thursday by Dr. Janee Morn to be seen.  He reports no new injury, no bleeding, no fevers, chills, is down to about 2 tablets left for pain.    Patient is a 27 y.o. male presenting with wound check. The history is provided by the patient and medical records.  Wound Check    History reviewed. No pertinent past medical history.  Past Surgical History  Procedure Laterality Date  . Ankle surgery      History reviewed. No pertinent family history.  History  Substance Use Topics  . Smoking status: Current Every Day Smoker  . Smokeless tobacco: Not on file  . Alcohol Use: Yes      Review of Systems  Constitutional: Negative for fever and chills.  Skin: Positive for wound. Negative for color change.  Neurological: Positive for weakness. Negative for numbness.    Allergies  Review of patient's allergies indicates no known allergies.  Home Medications   Current Outpatient Rx  Name  Route  Sig  Dispense  Refill  . oxyCODONE-acetaminophen (PERCOCET) 7.5-325 MG per tablet   Oral   Take 1 tablet by mouth every 4 (four) hours as needed for pain.   15 tablet   0   .  oxyCODONE-acetaminophen (PERCOCET/ROXICET) 5-325 MG per tablet   Oral   Take 2 tablets by mouth every 4 (four) hours as needed for pain.   16 tablet   0   . sulfamethoxazole-trimethoprim (SEPTRA DS) 800-160 MG per tablet   Oral   Take 1 tablet by mouth every 12 (twelve) hours.   14 tablet   0     BP 151/91  Pulse 113  Temp(Src) 98.3 F (36.8 C) (Oral)  Resp 20  SpO2 99%  Physical Exam  Nursing note and vitals reviewed. Constitutional: He appears well-developed and well-nourished. No distress.  Cardiovascular:  Pulses:      Radial pulses are 2+ on the left side.  Musculoskeletal:  Pt with 3 sutures in laceration along dorsum of distal finger, slight avulsion with avulsion of nail bed laterally.  DP is held in slight flexion and he is unable to extend at fingertip.  Flexion is intact. Gross sensation is intact. Normal color and good cap refill.    Skin: He is not diaphoretic.    ED Course  Procedures (including critical care time)  Labs Reviewed - No data to display No results found.   1. Screening     ra sat is 99% and I interpret to be normal   2:48 PM Pt was apparently called on his phone and asked if he could go to the office there by 3:30 PM.  MDM  Will call Dr. Janee Morn to arrange plan.  Pt with extensor tendon defect.  Needs appropriate hand surgery follow up.          Gavin Pound. Maceo Hernan, MD 01/13/13 1448

## 2013-01-13 NOTE — ED Notes (Addendum)
Pt going to see orthopedist, Dr Oletta Lamas aware.

## 2013-01-13 NOTE — ED Notes (Signed)
Wound recheck left 3rd digit. Dressing removed. Sutures intact. Healing well.

## 2013-08-26 ENCOUNTER — Encounter (HOSPITAL_BASED_OUTPATIENT_CLINIC_OR_DEPARTMENT_OTHER): Payer: Self-pay | Admitting: Emergency Medicine

## 2013-08-26 ENCOUNTER — Emergency Department (HOSPITAL_BASED_OUTPATIENT_CLINIC_OR_DEPARTMENT_OTHER)
Admission: EM | Admit: 2013-08-26 | Discharge: 2013-08-26 | Disposition: A | Discharge status: Home / Self Care | Payer: Self-pay | Attending: Emergency Medicine | Admitting: Emergency Medicine

## 2013-08-26 DIAGNOSIS — J329 Chronic sinusitis, unspecified: Secondary | ICD-10-CM | POA: Insufficient documentation

## 2013-08-26 DIAGNOSIS — H6690 Otitis media, unspecified, unspecified ear: Secondary | ICD-10-CM | POA: Insufficient documentation

## 2013-08-26 DIAGNOSIS — H5789 Other specified disorders of eye and adnexa: Secondary | ICD-10-CM | POA: Insufficient documentation

## 2013-08-26 DIAGNOSIS — F17200 Nicotine dependence, unspecified, uncomplicated: Secondary | ICD-10-CM | POA: Insufficient documentation

## 2013-08-26 DIAGNOSIS — R000 Tachycardia, unspecified: Secondary | ICD-10-CM | POA: Insufficient documentation

## 2013-08-26 DIAGNOSIS — J40 Bronchitis, not specified as acute or chronic: Secondary | ICD-10-CM

## 2013-08-26 DIAGNOSIS — J209 Acute bronchitis, unspecified: Secondary | ICD-10-CM | POA: Insufficient documentation

## 2013-08-26 MED ORDER — GUAIFENESIN-CODEINE 100-10 MG/5ML PO SYRP: 5.0000 mL | ORAL_SOLUTION | Freq: Three times a day (TID) | ORAL | Status: DC | PRN

## 2013-08-26 MED ORDER — AMOXICILLIN-POT CLAVULANATE 875-125 MG PO TABS: 1.0000 | ORAL_TABLET | Freq: Two times a day (BID) | ORAL | Status: DC

## 2013-08-26 MED ORDER — ALBUTEROL SULFATE HFA 108 (90 BASE) MCG/ACT IN AERS
2.0000 | INHALATION_SPRAY | RESPIRATORY_TRACT | Status: DC | PRN
  Administered 2013-08-26: 2 via RESPIRATORY_TRACT
  Filled 2013-08-26: qty 6.7

## 2013-08-26 NOTE — ED Notes (Addendum)
Pt reports right ear pain since last night- right jaw pain today- "cold" x 2-3 months- reports chest "tight" x 1 week- pt speaking in complete sentences- resp even and unlabored-

## 2013-08-26 NOTE — Discharge Instructions (Signed)
Bronchitis °Bronchitis is a problem of the air tubes leading to your lungs. This problem makes it hard for air to get in and out of the lungs. You may cough a lot because your air tubes are narrow. Going without care can cause lasting (chronic) bronchitis. °HOME CARE  °· Drink enough fluids to keep your pee (urine) clear or pale yellow. °· Use a cool mist humidifier. °· Quit smoking if you smoke. If you keep smoking, the bronchitis might not get better. °· Only take medicine as told by your doctor. °GET HELP RIGHT AWAY IF:  °· Coughing keeps you awake. °· You start to wheeze. °· You become more sick or weak. °· You have a hard time breathing or get short of breath. °· You cough up blood. °· Coughing lasts more than 2 weeks. °· You have a fever. °· Your baby is older than 3 months with a rectal temperature of 102° F (38.9° C) or higher. °· Your baby is 3 months old or younger with a rectal temperature of 100.4° F (38° C) or higher. °MAKE SURE YOU: °· Understand these instructions. °· Will watch your condition. °· Will get help right away if you are not doing well or get worse. °Document Released: 01/27/2008 Document Revised: 11/02/2011 Document Reviewed: 04/04/2013 °Exit[employer]® Patient Information ©2014 [employer]. ° °

## 2013-08-26 NOTE — ED Provider Notes (Signed)
CSN: [id number]     Arrival date & time 08/26/13  1537 History   First MD Initiated Contact with Patient 08/26/13 1725     Chief Complaint  Patient presents with  . Jaw Pain   (Consider location/radiation/quality/duration/timing/severity/associated sxs/prior Treatment) Patient is a 28 y.o. male presenting with URI. The history is provided by the patient.  URI Presenting symptoms: congestion, cough, ear pain, facial pain, rhinorrhea and sore throat   Presenting symptoms: no fever   Severity:  Moderate Onset quality:  Gradual Duration:  2 weeks Timing:  Constant Progression:  Worsening Chronicity:  New Relieved by:  Nothing Worsened by:  Nothing tried Ineffective treatments:  None tried Associated symptoms: sneezing and wheezing   Associated symptoms: no neck pain    Jon [name] is a 28 y.o. male who presents to the ED with cough, cold and congestion that has been ongoing x 2 weeks. He used OTC cough medications. Yesterday he began having right ear pain and jaw pain. He took one of his friend's Amoxil 500 tablets this morning.   History reviewed. No pertinent past medical history. Past Surgical History  Procedure Laterality Date  . Ankle surgery     No family history on file. History  Substance Use Topics  . Smoking status: Current Every Day Smoker    Types: Cigarettes  . Smokeless tobacco: Former NeurosurgeonUser  . Alcohol Use: 4.8 oz/week    8 Cans of beer per week    Review of Systems  Constitutional: Negative for fever and chills.  HENT: Positive for congestion, ear pain, rhinorrhea, sinus pressure, sneezing and sore throat. Negative for nosebleeds and trouble swallowing.   Eyes: Positive for discharge and itching. Negative for pain and visual disturbance.  Respiratory: Positive for cough and wheezing.   Cardiovascular: Negative for chest pain.  Gastrointestinal: Negative for nausea, vomiting and abdominal pain.  Genitourinary: Negative for dysuria and decreased urine  volume.  Musculoskeletal: Negative for back pain and neck pain.  Skin: Negative for rash.  Allergic/Immunologic: Negative for immunocompromised state.  Psychiatric/Behavioral: Negative for confusion. The patient is not nervous/anxious.     Allergies  Review of patient's allergies indicates no known allergies.  Home Medications   Current Outpatient Rx  Name  Route  Sig  Dispense  Refill  . oxyCODONE-acetaminophen (PERCOCET) 7.5-325 MG per tablet   Oral   Take 1 tablet by mouth every 4 (four) hours as needed for pain.   15 tablet   0   . oxyCODONE-acetaminophen (PERCOCET/ROXICET) 5-325 MG per tablet   Oral   Take 2 tablets by mouth every 4 (four) hours as needed for pain.   16 tablet   0   . sulfamethoxazole-trimethoprim (SEPTRA DS) 800-160 MG per tablet   Oral   Take 1 tablet by mouth every 12 (twelve) hours.   14 tablet   0    BP 147/98  Pulse 111  Temp(Src) 98.5 F (36.9 C) (Oral)  Resp 18  Ht 5\' 11"  (1.803 m)  Wt 262 lb (118.842 kg)  BMI 36.56 kg/m2  SpO2 99% Physical Exam  Nursing note and vitals reviewed. Constitutional: He is oriented to person, place, and time. He appears well-developed and well-nourished. No distress.  HENT:  Head: Normocephalic and atraumatic.  Right Ear: Tympanic membrane is erythematous.  Left Ear: Tympanic membrane is erythematous.  Nose: Mucosal edema and rhinorrhea present. Right sinus exhibits maxillary sinus tenderness. Left sinus exhibits maxillary sinus tenderness.  Mouth/Throat: Uvula is midline and mucous membranes are  normal. Posterior oropharyngeal erythema present.  Eyes: EOM are normal.  Neck: Neck supple.  Cardiovascular: Tachycardia present.   Pulmonary/Chest: Effort normal. He has no wheezes. He has no rales.  Abdominal: Soft. Bowel sounds are normal. There is no tenderness.  Musculoskeletal: Normal range of motion.  Neurological: He is alert and oriented to person, place, and time. No cranial nerve deficit.  Skin:  Skin is warm and dry.  Psychiatric: He has a normal mood and affect. His behavior is normal.    ED Course  Procedures  MDM  28 y.o. male with otitis media, bronchitis and sinusitis. Will treat with antibiotics and cough medications. Discussed with the patient and all questioned fully answered. He will return if any problems arise. Discussed with patient importance of follow up with PCP to be sure symptoms are improving. He voices understanding.    Medication List    TAKE these medications       amoxicillin-clavulanate 875-125 MG per tablet  Commonly known as:  AUGMENTIN  Take 1 tablet by mouth 2 (two) times daily.     guaiFENesin-codeine 100-10 MG/5ML syrup  Commonly known as:  ROBITUSSIN AC  Take 5 mLs by mouth 3 (three) times daily as needed for cough.      ASK your doctor about these medications       oxyCODONE-acetaminophen 7.5-325 MG per tablet  Commonly known as:  PERCOCET  Take 1 tablet by mouth every 4 (four) hours as needed for pain.     oxyCODONE-acetaminophen 5-325 MG per tablet  Commonly known as:  PERCOCET/ROXICET  Take 2 tablets by mouth every 4 (four) hours as needed for pain.     sulfamethoxazole-trimethoprim 800-160 MG per tablet  Commonly known as:  SEPTRA DS  Take 1 tablet by mouth every 12 (twelve) hours.           [address] [place], [place] 08/29/13 [phone]

## 2013-08-30 NOTE — ED Provider Notes (Signed)
Medical screening examination/treatment/procedure(s) were performed by non-physician practitioner and as supervising physician I was immediately available for consultation/collaboration.  EKG Interpretation   None        Doug SouSam Voncile Schwarz, MD 08/30/13 1415

## 2014-12-14 ENCOUNTER — Emergency Department (HOSPITAL_COMMUNITY)
Admission: EM | Admit: 2014-12-14 | Discharge: 2014-12-14 | Disposition: A | Discharge status: Home / Self Care | Payer: 59 | Attending: Emergency Medicine | Admitting: Emergency Medicine

## 2014-12-14 ENCOUNTER — Encounter (HOSPITAL_COMMUNITY): Payer: Self-pay | Admitting: Emergency Medicine

## 2014-12-14 DIAGNOSIS — S59912A Unspecified injury of left forearm, initial encounter: Secondary | ICD-10-CM | POA: Diagnosis present

## 2014-12-14 DIAGNOSIS — Y998 Other external cause status: Secondary | ICD-10-CM | POA: Diagnosis not present

## 2014-12-14 DIAGNOSIS — Y9289 Other specified places as the place of occurrence of the external cause: Secondary | ICD-10-CM | POA: Insufficient documentation

## 2014-12-14 DIAGNOSIS — Y288XXA Contact with other sharp object, undetermined intent, initial encounter: Secondary | ICD-10-CM | POA: Diagnosis not present

## 2014-12-14 DIAGNOSIS — Z720 Tobacco use: Secondary | ICD-10-CM | POA: Insufficient documentation

## 2014-12-14 DIAGNOSIS — Z792 Long term (current) use of antibiotics: Secondary | ICD-10-CM | POA: Diagnosis not present

## 2014-12-14 DIAGNOSIS — Y9389 Activity, other specified: Secondary | ICD-10-CM | POA: Insufficient documentation

## 2014-12-14 DIAGNOSIS — S51812A Laceration without foreign body of left forearm, initial encounter: Secondary | ICD-10-CM | POA: Insufficient documentation

## 2014-12-14 DIAGNOSIS — IMO0001 Reserved for inherently not codable concepts without codable children: Secondary | ICD-10-CM

## 2014-12-14 DIAGNOSIS — IMO0002 Reserved for concepts with insufficient information to code with codable children: Secondary | ICD-10-CM

## 2014-12-14 DIAGNOSIS — R030 Elevated blood-pressure reading, without diagnosis of hypertension: Secondary | ICD-10-CM | POA: Insufficient documentation

## 2014-12-14 MED ORDER — TRAMADOL HCL 50 MG PO TABS: 50.0000 mg | ORAL_TABLET | Freq: Four times a day (QID) | ORAL | Status: DC | PRN

## 2014-12-14 MED ORDER — LIDOCAINE HCL (PF) 1 % IJ SOLN
5.0000 mL | Freq: Once | INTRAMUSCULAR | Status: AC
  Administered 2014-12-14: 5 mL
  Filled 2014-12-14: qty 5

## 2014-12-14 NOTE — ED Provider Notes (Signed)
CSN: [id number]     Arrival date & time 12/14/14  1404 History   First MD Initiated Contact with Patient 12/14/14 1411     Chief Complaint  Patient presents with  . Laceration     (Consider location/radiation/quality/duration/timing/severity/associated sxs/prior Treatment) The history is provided by the patient.   Jon [name] is a 29 y.o. right handed male presenting with laceration to his left upper forearm occurring just prior to arrival at work.  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  The wound bled copiously prior to arrival but is currently hemostatic after applying pressure.  He denies significant pain this time.  He is current on his tetanus.     History reviewed. No pertinent past medical history. Past Surgical History  Procedure Laterality Date  . Ankle surgery     History reviewed. No pertinent family history. History  Substance Use Topics  . Smoking status: Current Every Day Smoker -- 0.50 packs/day    Types: Cigarettes  . Smokeless tobacco: Former Neurosurgeon  . Alcohol Use: 3.6 oz/week    6 Cans of beer per week    Review of Systems  Constitutional: Negative for fever and chills.  Respiratory: Negative for shortness of breath and wheezing.   Skin: Positive for wound.  Neurological: Negative for numbness.      Allergies  Review of patient's allergies indicates no known allergies.  Home Medications   Prior to Admission medications   Medication Sig Start Date End Date Taking? Authorizing Provider  amoxicillin-clavulanate (AUGMENTIN) 875-125 MG per tablet Take 1 tablet by mouth 2 (two) times daily. 08/26/13   Hope Orlene Och, NP  guaiFENesin-codeine (ROBITUSSIN AC) 100-10 MG/5ML syrup Take 5 mLs by mouth 3 (three) times daily as needed for cough. 08/26/13   Hope Orlene Och, NP  oxyCODONE-acetaminophen (PERCOCET) 7.5-325 MG per tablet Take 1 tablet by mouth every 4 (four) hours as needed for pain. 06/14/12    Nelva Nay, MD  oxyCODONE-acetaminophen (PERCOCET/ROXICET) 5-325 MG per tablet Take 2 tablets by mouth every 4 (four) hours as needed for pain. 01/10/13   Elson Areas, PA-C  sulfamethoxazole-trimethoprim (SEPTRA DS) 800-160 MG per tablet Take 1 tablet by mouth every 12 (twelve) hours. 01/10/13   Elson Areas, PA-C  traMADol (ULTRAM) 50 MG tablet Take 1 tablet (50 mg total) by mouth every 6 (six) hours as needed. 12/14/14   Burgess Amor, PA-C   BP 160/97 mmHg  Pulse 101  Temp(Src) 98.7 F (37.1 C) (Oral)  Resp 18  Ht  (1.803 m)  Wt 255 lb (115.667 kg)  BMI 35.58 kg/m2  SpO2 97% Physical Exam  Constitutional: He is oriented to person, place, and time. He appears well-developed and well-nourished.  HENT:  Head: Normocephalic.  Cardiovascular: Normal rate.   Pulmonary/Chest: Effort normal.  Musculoskeletal: He exhibits tenderness.  1.5 cm laceration left medial forearm,  Hemostatic. Subcutaneous.  Neurological: He is alert and oriented to person, place, and time. No sensory deficit.  Skin: Laceration noted.    ED Course  Procedures (including critical care time)  LACERATION REPAIR Performed by: Burgess Amor Authorized by: Burgess Amor Consent: Verbal consent obtained. Risks and benefits: risks, benefits and alternatives were discussed Consent given by: patient Patient identity confirmed: provided demographic data Prepped and Draped in normal sterile fashion Wound explored  Laceration Location: left forearm  Laceration Length: 1.5 cm  No Foreign Bodies seen or palpated  Anesthesia: local infiltration  Local anesthetic: lidocaine 1% without epinephrine  Anesthetic total: 2 ml  Irrigation method: syringe Amount of cleaning: standard  Skin closure: ethilon 4-0  Number of sutures: 4  Technique: simple interrupted  Patient tolerance: Patient tolerated the procedure well with no immediate complications.  Labs Review Labs Reviewed - No data to  display  Imaging Review No results found.   EKG Interpretation None      MDM   Final diagnoses:  Laceration  Elevated blood pressure    Wound care instructions given.  Pt advised to have sutures removed in 10 days,  Return here sooner for any signs of infection including redness, swelling, worse pain or drainage of pus.  Discussed elevated blood pressure.  Patient states his blood pressures currently being monitored by his PCP.  Has an appointment with him in 3 weeks for a recheck and at that point will be placed on blood pressure medication if still elevated.       Burgess AmorJulie Glenette Bookwalter, PA-C 12/14/14 1505  Blane OharaJoshua Zavitz, MD 12/15/14 [phone]

## 2014-12-14 NOTE — ED Notes (Signed)
Pt with laceration to his L forearm from sheet metal.

## 2014-12-14 NOTE — ED Notes (Signed)
Kerlix dressing applied to wound.

## 2014-12-14 NOTE — Discharge Instructions (Signed)

## 2015-01-16 ENCOUNTER — Emergency Department (HOSPITAL_BASED_OUTPATIENT_CLINIC_OR_DEPARTMENT_OTHER): Payer: 59

## 2015-01-16 ENCOUNTER — Encounter (HOSPITAL_BASED_OUTPATIENT_CLINIC_OR_DEPARTMENT_OTHER): Payer: Self-pay | Admitting: *Deleted

## 2015-01-16 DIAGNOSIS — S93402A Sprain of unspecified ligament of left ankle, initial encounter: Secondary | ICD-10-CM | POA: Insufficient documentation

## 2015-01-16 DIAGNOSIS — X58XXXA Exposure to other specified factors, initial encounter: Secondary | ICD-10-CM | POA: Insufficient documentation

## 2015-01-16 DIAGNOSIS — Y9339 Activity, other involving climbing, rappelling and jumping off: Secondary | ICD-10-CM | POA: Insufficient documentation

## 2015-01-16 DIAGNOSIS — Y9289 Other specified places as the place of occurrence of the external cause: Secondary | ICD-10-CM | POA: Insufficient documentation

## 2015-01-16 DIAGNOSIS — Z9889 Other specified postprocedural states: Secondary | ICD-10-CM | POA: Insufficient documentation

## 2015-01-16 DIAGNOSIS — Z79899 Other long term (current) drug therapy: Secondary | ICD-10-CM | POA: Insufficient documentation

## 2015-01-16 DIAGNOSIS — Y998 Other external cause status: Secondary | ICD-10-CM | POA: Insufficient documentation

## 2015-01-16 DIAGNOSIS — Z792 Long term (current) use of antibiotics: Secondary | ICD-10-CM | POA: Insufficient documentation

## 2015-01-16 DIAGNOSIS — Z720 Tobacco use: Secondary | ICD-10-CM | POA: Insufficient documentation

## 2015-01-16 NOTE — ED Notes (Signed)
Pt was working this afternoon and injured his left ankle

## 2015-01-16 NOTE — ED Notes (Signed)
Patient transported to X-ray 

## 2015-01-17 ENCOUNTER — Emergency Department (HOSPITAL_BASED_OUTPATIENT_CLINIC_OR_DEPARTMENT_OTHER)
Admission: EM | Admit: 2015-01-17 | Discharge: 2015-01-17 | Disposition: A | Discharge status: Home / Self Care | Payer: 59 | Attending: Emergency Medicine | Admitting: Emergency Medicine

## 2015-01-17 DIAGNOSIS — S93402A Sprain of unspecified ligament of left ankle, initial encounter: Secondary | ICD-10-CM

## 2015-01-17 MED ORDER — HYDROCODONE-ACETAMINOPHEN 5-325 MG PO TABS
1.0000 | ORAL_TABLET | Freq: Once | ORAL | Status: AC
Start: 2015-01-17 — End: 2015-01-17
  Administered 2015-01-17: 1 via ORAL
  Filled 2015-01-17: qty 1

## 2015-01-17 NOTE — Discharge Instructions (Signed)

## 2015-01-17 NOTE — ED Provider Notes (Signed)
CSN: [id number]     Arrival date & time 01/16/15  2318 History   First MD Initiated Contact with Patient 01/17/15 0018     Chief Complaint  Patient presents with  . Ankle Pain     (Consider location/radiation/quality/duration/timing/severity/associated sxs/prior Treatment) Patient is a 29 y.o. male presenting with ankle pain.  Ankle Pain Location:  Ankle Injury: yes   Mechanism of injury comment:  Inversion Ankle location:  L ankle Pain details:    Quality:  Aching   Severity:  Moderate   Onset quality:  Sudden   Duration:  12 hours   Timing:  Constant   Progression:  Unchanged Chronicity:  New Prior injury to area:  Yes Relieved by:  Nothing Worsened by:  Abduction, bearing weight, flexion and extension Associated symptoms: decreased ROM (2/2 pain) and swelling   Associated symptoms: no back pain and no tingling     History reviewed. No pertinent past medical history. Past Surgical History  Procedure Laterality Date  . Ankle surgery    . Pelvic fracture surgery     No family history on file. History  Substance Use Topics  . Smoking status: Current Every Day Smoker -- 0.50 packs/day    Types: Cigarettes  . Smokeless tobacco: Former NeurosurgeonUser  . Alcohol Use: 3.6 oz/week    6 Cans of beer per week    Review of Systems  Musculoskeletal: Negative for back pain.  All other systems reviewed and are negative.     Allergies  Review of patient's allergies indicates no known allergies.  Home Medications   Prior to Admission medications   Medication Sig Start Date End Date Taking? Authorizing Provider  amphetamine-dextroamphetamine (ADDERALL) 20 MG tablet Take 20 mg by mouth daily.   Yes Historical Provider, MD  clonazePAM (KLONOPIN) 1 MG tablet Take 1 mg by mouth 2 (two) times daily.   Yes Historical Provider, MD  amoxicillin-clavulanate (AUGMENTIN) 875-125 MG per tablet Take 1 tablet by mouth 2 (two) times daily. 08/26/13   Hope Orlene OchM Neese, NP  guaiFENesin-codeine  (ROBITUSSIN AC) 100-10 MG/5ML syrup Take 5 mLs by mouth 3 (three) times daily as needed for cough. 08/26/13   Hope Orlene OchM Neese, NP  oxyCODONE-acetaminophen (PERCOCET) 7.5-325 MG per tablet Take 1 tablet by mouth every 4 (four) hours as needed for pain. 06/14/12   Nelva Nayobert Beaton, MD  oxyCODONE-acetaminophen (PERCOCET/ROXICET) 5-325 MG per tablet Take 2 tablets by mouth every 4 (four) hours as needed for pain. 01/10/13   Elson AreasLeslie K Sofia, PA-C  sulfamethoxazole-trimethoprim (SEPTRA DS) 800-160 MG per tablet Take 1 tablet by mouth every 12 (twelve) hours. 01/10/13   Elson AreasLeslie K Sofia, PA-C  traMADol (ULTRAM) 50 MG tablet Take 1 tablet (50 mg total) by mouth every 6 (six) hours as needed. 12/14/14   Burgess AmorJulie Idol, PA-C   BP 150/102 mmHg  Pulse 112  Temp(Src) 98.3 F (36.8 C) (Oral)  Resp 20  Ht 5\' 11"  (1.803 m)  Wt 250 lb (113.399 kg)  BMI 34.88 kg/m2  SpO2 95% Physical Exam  Constitutional: He is oriented to person, place, and time. He appears well-developed and well-nourished.  HENT:  Head: Normocephalic and atraumatic.  Eyes: Conjunctivae and EOM are normal.  Neck: Normal range of motion. Neck supple.  Cardiovascular: Normal rate, regular rhythm and normal heart sounds.   Pulmonary/Chest: Effort normal and breath sounds normal. No respiratory distress.  Abdominal: He exhibits no distension. There is no tenderness. There is no rebound and no guarding.  Musculoskeletal: Normal range of motion.  Left ankle: He exhibits swelling and ecchymosis. He exhibits normal range of motion, no laceration and normal pulse. Tenderness. Lateral malleolus and medial malleolus tenderness found.  Neurological: He is alert and oriented to person, place, and time.  Skin: Skin is warm and dry.  Vitals reviewed.   ED Course  Procedures (including critical care time) Labs Review Labs Reviewed - No data to display  Imaging Review Dg Ankle Complete Left  01/17/2015   CLINICAL DATA:  Fall with left ankle injury  EXAM:  LEFT ANKLE COMPLETE - 3+ VIEW  COMPARISON:  None.  FINDINGS: There is no evidence of fracture, dislocation, or joint effusion.  Remote bimalleolar fracture ORIF.  No adverse findings.  Chronic appearing ossification dorsal to the talar neck and navicular.  IMPRESSION: No acute osseous findings.   Electronically Signed   By: Marnee Spring M.D.   On: 01/17/2015 00:37     EKG Interpretation None      MDM   Final diagnoses:  Ankle sprain, left, initial encounter    29 y.o. male with pertinent PMH of prior L ankle fracture presents with L ankle sprain.  Pulses intact, sensation intact.  No tenderness proximal or distal to ankle.  Wu unremarkable.  Splint applied, will fu with PCP (pt states he has one), and ortho as needed.    I have reviewed all laboratory and imaging studies if ordered as above  1. Ankle sprain, left, initial encounter         Mirian Mo, MD 01/17/15 [phone]

## 2015-01-17 NOTE — ED Notes (Signed)
Pt jumped of fork lift at 1400 yesterday and twisted left ankle,  Slight swelling noted,  Ice applied to ankle, Pt very intoxicated

## 2015-01-27 ENCOUNTER — Emergency Department (HOSPITAL_COMMUNITY)
Admission: EM | Admit: 2015-01-27 | Discharge: 2015-01-27 | Disposition: A | Discharge status: Home / Self Care | Payer: 59 | Attending: Emergency Medicine | Admitting: Emergency Medicine

## 2015-01-27 ENCOUNTER — Encounter (HOSPITAL_COMMUNITY): Payer: Self-pay | Admitting: Emergency Medicine

## 2015-01-27 DIAGNOSIS — Z789 Other specified health status: Secondary | ICD-10-CM

## 2015-01-27 DIAGNOSIS — F1310 Sedative, hypnotic or anxiolytic abuse, uncomplicated: Secondary | ICD-10-CM | POA: Insufficient documentation

## 2015-01-27 DIAGNOSIS — Z7289 Other problems related to lifestyle: Secondary | ICD-10-CM

## 2015-01-27 DIAGNOSIS — F1210 Cannabis abuse, uncomplicated: Secondary | ICD-10-CM | POA: Insufficient documentation

## 2015-01-27 DIAGNOSIS — Z720 Tobacco use: Secondary | ICD-10-CM | POA: Insufficient documentation

## 2015-01-27 DIAGNOSIS — F1099 Alcohol use, unspecified with unspecified alcohol-induced disorder: Secondary | ICD-10-CM | POA: Insufficient documentation

## 2015-01-27 DIAGNOSIS — Z79899 Other long term (current) drug therapy: Secondary | ICD-10-CM | POA: Insufficient documentation

## 2015-01-27 DIAGNOSIS — F1410 Cocaine abuse, uncomplicated: Secondary | ICD-10-CM | POA: Insufficient documentation

## 2015-01-27 LAB — COMPREHENSIVE METABOLIC PANEL
ALBUMIN: 4.2 g/dL (ref 3.5–5.0)
ALK PHOS: 78 U/L (ref 38–126)
ALT: 49 U/L (ref 17–63)
ANION GAP: 12 (ref 5–15)
AST: 38 U/L (ref 15–41)
BILIRUBIN TOTAL: 0.6 mg/dL (ref 0.3–1.2)
CO2: 24 mmol/L (ref 22–32)
Calcium: 9.7 mg/dL (ref 8.9–10.3)
Chloride: 102 mmol/L (ref 101–111)
Creatinine, Ser: 1.16 mg/dL (ref 0.61–1.24)
GFR calc Af Amer: >60 mL/min (ref >60)
Glucose, Bld: 96 mg/dL (ref 65–99)
Potassium: 4 mmol/L (ref 3.5–5.1)
Sodium: 138 mmol/L (ref 135–145)
TOTAL PROTEIN: 7.6 g/dL (ref 6.5–8.1)

## 2015-01-27 LAB — RAPID URINE DRUG SCREEN, HOSP PERFORMED
AMPHETAMINES: NOT DETECTED
BARBITURATES: NOT DETECTED
BENZODIAZEPINES: POSITIVE — AB
COCAINE: POSITIVE — AB
OPIATES: NOT DETECTED
Tetrahydrocannabinol: POSITIVE — AB

## 2015-01-27 LAB — ETHANOL

## 2015-01-27 LAB — CBC
HCT: 47.6 % (ref 39.0–52.0)
Hemoglobin: 16.6 g/dL (ref 13.0–17.0)
MCH: 31.3 pg (ref 26.0–34.0)
MCHC: 34.9 g/dL (ref 30.0–36.0)
MCV: 89.8 fL (ref 78.0–100.0)
PLATELETS: 255 10*3/uL (ref 150–400)
RBC: 5.3 MIL/uL (ref 4.22–5.81)
RDW: 13.7 % (ref 11.5–15.5)
WBC: 11.6 10*3/uL — AB (ref 4.0–10.5)

## 2015-01-27 LAB — SALICYLATE LEVEL: Salicylate Lvl: <4 mg/dL (ref 2.8–30.0)

## 2015-01-27 LAB — ACETAMINOPHEN LEVEL: Acetaminophen (Tylenol), Serum: <10 ug/mL — ABNORMAL LOW (ref 10–30)

## 2015-01-27 MED ORDER — DICYCLOMINE HCL 20 MG PO TABS: 20.0000 mg | ORAL_TABLET | Freq: Two times a day (BID) | ORAL | Status: DC

## 2015-01-27 MED ORDER — ONDANSETRON 4 MG PO TBDP: ORAL_TABLET | ORAL | Status: DC

## 2015-01-27 NOTE — ED Notes (Addendum)
Patient here for detox of alcohol.  Patient stated his last drink was this morning around 0200.  Patient feels okay, feels like he is having some tremors, sweating and high BP.  Patient is CAOx3.  Wife with patient.  Last used street drugs 2-3 weeks ago, denies SI or HI.

## 2015-01-27 NOTE — ED Provider Notes (Signed)
CSN: [id number]     Arrival date & time 01/27/15  2014 History   First MD Initiated Contact with Patient 01/27/15 2118     Chief Complaint  Patient presents with  . Drug / Alcohol Assessment     (Consider location/radiation/quality/duration/timing/severity/associated sxs/prior Treatment) HPI Jon [name] is a 29 y.o. male who comes in for evaluation for alcohol detox. Patient states he has not been through a detox program before. States he would like to detox now because he has a newborn daughter. Denies any seizures associated with alcohol withdrawal. Reports he mostly gets irritable, shaky and tired. He endorses associated, intermittent cocaine and marijuana use. States he also uses Klonopin that is prescribed to him by Benedetto GoadFred Wilson, his PCP. Last drink was at approximately 2:30 AM this morning. States he typically drinks anywhere from 12-18, 12 ounce beers per day. Denies any suicidal or homicidal ideations. No auditory or visual hallucinations.  History reviewed. No pertinent past medical history. Past Surgical History  Procedure Laterality Date  . Ankle surgery    . Pelvic fracture surgery     No family history on file. History  Substance Use Topics  . Smoking status: Current Every Day Smoker -- 0.50 packs/day    Types: Cigarettes  . Smokeless tobacco: Former NeurosurgeonUser  . Alcohol Use: 3.6 oz/week    6 Cans of beer per week     Comment: 12-18 beers a day.    Review of Systems A 10 point review of systems was completed and was negative except for pertinent positives and negatives as mentioned in the history of present illness     Allergies  Review of patient's allergies indicates no known allergies.  Home Medications   Prior to Admission medications   Medication Sig Start Date End Date Taking? Authorizing Provider  amphetamine-dextroamphetamine (ADDERALL) 20 MG tablet Take 20 mg by mouth as needed (for concentration).    Yes Historical Provider, MD  clonazePAM  (KLONOPIN) 1 MG tablet Take 1 mg by mouth every morning.    Yes Historical Provider, MD  clonazePAM (KLONOPIN) 2 MG tablet Take 2 mg by mouth daily.   Yes Historical Provider, MD  amoxicillin-clavulanate (AUGMENTIN) 875-125 MG per tablet Take 1 tablet by mouth 2 (two) times daily. 08/26/13   Hope Orlene OchM Neese, NP  dicyclomine (BENTYL) 20 MG tablet Take 1 tablet (20 mg total) by mouth 2 (two) times daily. 01/27/15   Joycie PeekBenjamin Jeanpaul Biehl, PA-C  guaiFENesin-codeine (ROBITUSSIN AC) 100-10 MG/5ML syrup Take 5 mLs by mouth 3 (three) times daily as needed for cough. 08/26/13   Hope Orlene OchM Neese, NP  ondansetron (ZOFRAN ODT) 4 MG disintegrating tablet 4mg  ODT q4 hours prn nausea/vomit 01/27/15   Joycie PeekBenjamin Ramata Strothman, PA-C  oxyCODONE-acetaminophen (PERCOCET) 7.5-325 MG per tablet Take 1 tablet by mouth every 4 (four) hours as needed for pain. 06/14/12   Nelva Nayobert Beaton, MD  oxyCODONE-acetaminophen (PERCOCET/ROXICET) 5-325 MG per tablet Take 2 tablets by mouth every 4 (four) hours as needed for pain. 01/10/13   Elson AreasLeslie K Sofia, PA-C  sulfamethoxazole-trimethoprim (SEPTRA DS) 800-160 MG per tablet Take 1 tablet by mouth every 12 (twelve) hours. 01/10/13   Elson AreasLeslie K Sofia, PA-C  traMADol (ULTRAM) 50 MG tablet Take 1 tablet (50 mg total) by mouth every 6 (six) hours as needed. 12/14/14   Burgess AmorJulie Idol, PA-C   BP 125/75 mmHg  Pulse 92  Temp(Src) 98.3 F (36.8 C) (Oral)  Resp 16  Ht 5\' 11"  (1.803 m)  Wt 263 lb 9 oz (119.551 kg)  BMI 36.78 kg/m2  SpO2 97% Physical Exam  Constitutional: He is oriented to person, place, and time. He appears well-developed and well-nourished.  HENT:  Head: Normocephalic and atraumatic.  Mouth/Throat: Oropharynx is clear and moist.  Eyes: Conjunctivae are normal. Pupils are equal, round, and reactive to light. Right eye exhibits no discharge. Left eye exhibits no discharge. No scleral icterus.  Neck: Neck supple.  Cardiovascular: Normal rate, regular rhythm and normal heart sounds.   Pulmonary/Chest: Effort  normal and breath sounds normal. No respiratory distress. He has no wheezes. He has no rales.  Abdominal: Soft. There is no tenderness.  Musculoskeletal: He exhibits no tenderness.  Neurological: He is alert and oriented to person, place, and time.  Cranial Nerves II-XII grossly intact. Moves all extremities without ataxia. No tremor noted  Skin: Skin is warm and dry. No rash noted.  Psychiatric: He has a normal mood and affect.  Nursing note and vitals reviewed.   ED Course  Procedures (including critical care time) Labs Review Labs Reviewed  ACETAMINOPHEN LEVEL - Abnormal; Notable for the following:    Acetaminophen (Tylenol), Serum <10 (*)    All other components within normal limits  CBC - Abnormal; Notable for the following:    WBC 11.6 (*)    All other components within normal limits  COMPREHENSIVE METABOLIC PANEL - Abnormal; Notable for the following:    BUN <5 (*)    All other components within normal limits  URINE RAPID DRUG SCREEN (HOSP PERFORMED) NOT AT [facility] - Abnormal; Notable for the following:    Cocaine POSITIVE (*)    Benzodiazepines POSITIVE (*)    Tetrahydrocannabinol POSITIVE (*)    All other components within normal limits  ETHANOL  SALICYLATE LEVEL    Imaging Review No results found.   EKG Interpretation None     Filed Vitals:   01/27/15 2152 01/27/15 2200 01/27/15 2215 01/27/15 2230  BP: 150/90 133/62 127/85 125/75  Pulse: 101 78 92 92  Temp:      TempSrc:      Resp: Height:      Weight:      SpO2: 100% 96% 98% 97%   Meds given in ED:  Medications - No data to display  Discharge Medication List as of 01/27/2015 10:39 PM    START taking these medications   Details  dicyclomine (BENTYL) 20 MG tablet Take 1 tablet (20 mg total) by mouth 2 (two) times daily., Starting 01/27/2015, Until Discontinued, Print    ondansetron (ZOFRAN ODT) 4 MG disintegrating tablet  ODT q4 hours prn nausea/vomit, Print         MDM  Patient here  for evaluation of alcohol detox. No history of DTs. Last drink at 2:30 AM this morning. Patient typically drinks 12-18 beers per day. Also takes Klonopin daily as prescribed by his PCP. Reports associated cocaine and marijuana use. Denies suicidal or homicidal ideation. Patient's CIWA score is 4. Librium/Tegretol taper not necessary. Physical exam is not concerning and vital signs remained stable. Patient given outpatient resource guide to follow up with detox facility. Final diagnoses:  Alcohol use        Joycie Peek, PA-C 01/28/15 0454  Mancel Bale, MD 01/28/15 1019

## 2015-01-27 NOTE — Discharge Instructions (Signed)
Alcohol Use Disorder °Alcohol use disorder is a mental disorder. It is not a one-time incident of heavy drinking. Alcohol use disorder is the excessive and uncontrollable use of alcohol over time that leads to problems with functioning in one or more areas of daily living. People with this disorder risk harming themselves and others when they drink to excess. Alcohol use disorder also can cause other mental disorders, such as mood and anxiety disorders, and serious physical problems. People with alcohol use disorder often misuse other drugs.  °Alcohol use disorder is common and widespread. Some people with this disorder drink alcohol to cope with or escape from negative life events. Others drink to relieve chronic pain or symptoms of mental illness. People with a family history of alcohol use disorder are at higher risk of losing control and using alcohol to excess.  °SYMPTOMS  °Signs and symptoms of alcohol use disorder may include the following:  °· Consumption of alcohol in larger amounts or over a longer period of time than intended. °· Multiple unsuccessful attempts to cut down or control alcohol use.   °· A great deal of time spent obtaining alcohol, using alcohol, or recovering from the effects of alcohol (hangover). °· A strong desire or urge to use alcohol (cravings).   °· Continued use of alcohol despite problems at work, school, or home because of alcohol use.   °· Continued use of alcohol despite problems in relationships because of alcohol use. °· Continued use of alcohol in situations when it is physically hazardous, such as driving a car. °· Continued use of alcohol despite awareness of a physical or psychological problem that is likely related to alcohol use. Physical problems related to alcohol use can involve the brain, heart, liver, stomach, and intestines. Psychological problems related to alcohol use include intoxication, depression, anxiety, psychosis, delirium, and dementia.   °· The need for  increased amounts of alcohol to achieve the same desired effect, or a decreased effect from the consumption of the same amount of alcohol (tolerance). °· Withdrawal symptoms upon reducing or stopping alcohol use, or alcohol use to reduce or avoid withdrawal symptoms. Withdrawal symptoms include: °¨ Racing heart. °¨ Hand tremor. °¨ Difficulty sleeping. °¨ Nausea. °¨ Vomiting. °¨ Hallucinations. °¨ Restlessness. °¨ Seizures. °DIAGNOSIS °Alcohol use disorder is diagnosed through an assessment by your health care provider. Your health care provider may start by asking three or four questions to screen for excessive or problematic alcohol use. To confirm a diagnosis of alcohol use disorder, at least two symptoms must be present within a 12-month period. The severity of alcohol use disorder depends on the number of symptoms: °· Mild--two or three. °· Moderate--four or five. °· Severe--six or more. °Your health care provider may perform a physical exam or use results from lab tests to see if you have physical problems resulting from alcohol use. Your health care provider may refer you to a mental health professional for evaluation. °TREATMENT  °Some people with alcohol use disorder are able to reduce their alcohol use to low-risk levels. Some people with alcohol use disorder need to quit drinking alcohol. When necessary, mental health professionals with specialized training in substance use treatment can help. Your health care provider can help you decide how severe your alcohol use disorder is and what type of treatment you need. The following forms of treatment are available:  °· Detoxification. Detoxification involves the use of prescription medicines to prevent alcohol withdrawal symptoms in the first week after quitting. This is important for people with a history of symptoms   of withdrawal and for heavy drinkers who are likely to have withdrawal symptoms. Alcohol withdrawal can be dangerous and, in severe cases, cause  death. Detoxification is usually provided in a hospital or in-patient substance use treatment facility. °· Counseling or talk therapy. Talk therapy is provided by substance use treatment counselors. It addresses the reasons people use alcohol and ways to keep them from drinking again. The goals of talk therapy are to help people with alcohol use disorder find healthy activities and ways to cope with life stress, to identify and avoid triggers for alcohol use, and to handle cravings, which can cause relapse. °· Medicines. Different medicines can help treat alcohol use disorder through the following actions: °¨ Decrease alcohol cravings. °¨ Decrease the positive reward response felt from alcohol use. °¨ Produce an uncomfortable physical reaction when alcohol is used (aversion therapy). °· Support groups. Support groups are run by people who have quit drinking. They provide emotional support, advice, and guidance. °These forms of treatment are often combined. Some people with alcohol use disorder benefit from intensive combination treatment provided by specialized substance use treatment centers. Both inpatient and outpatient treatment programs are available. °Document Released: 09/17/2004 Document Revised: 12/25/2013 Document Reviewed: 11/17/2012 °ExitCare® Patient Information ©2015 [employer]. This information is not intended to replace advice given to you by your health care provider. Make sure you discuss any questions you have with your health care provider. ° ° ° ° °Emergency Department Resource Guide °1) Find a Doctor and Pay Out of Pocket °Although you won't have to find out who is covered by your insurance plan, it is a good idea to ask around and get recommendations. You will then need to call the office and see if the doctor you have chosen will accept you as a new patient and what types of options they offer for patients who are self-pay. Some doctors offer discounts or will set up payment plans for  their patients who do not have insurance, but you will need to ask so you aren't surprised when you get to your appointment. ° °2) Contact Your Local Health Department °Not all health departments have doctors that can see patients for sick visits, but many do, so it is worth a call to see if yours does. If you don't know where your local health department is, you can check in your phone book. The CDC also has a tool to help you locate your state's health department, and many state websites also have listings of all of their local health departments. ° °3) Find a Walk-in Clinic °If your illness is not likely to be very severe or complicated, you may want to try a walk in clinic. These are popping up all over the country in pharmacies, drugstores, and shopping centers. They're usually staffed by nurse practitioners or physician assistants that have been trained to treat common illnesses and complaints. They're usually fairly quick and inexpensive. However, if you have serious medical issues or chronic medical problems, these are probably not your best option. ° °No Primary Care Doctor: °- Call [facility] at  832-8000 - they can help you locate a primary care doctor that  accepts your insurance, provides certain services, etc. °- Physician Referral Service- 1-800-533-3463 ° °Chronic Pain Problems: °Organization         Address  Phone   Notes  °[facility]  (336) 297-2271 Patients need to be referred by their primary care doctor.  ° °Medication Assistance: °Organization           Address  Phone   Notes  °[place] [facility] [address] °[place], [place] [address] (336) 641-8030 --Must be a resident of [place] °-- Must have NO insurance coverage whatsoever (no Medicaid/ Medicare, etc.) °-- The pt. MUST have a primary care doctor that directs their care regularly and follows them in the community °  °[facility]  (866) 331-1348   °[employer]  (888)  892-1162   ° °Agencies that provide inexpensive medical care: °Organization         Address  Phone   Notes  °[facility]  (336) 832-8035   °[facility]    (336) 832-7272   °[facility] [address] °[place], [place] [address] (336) 832-4777   °[facility] [address], °[place] (336) 271-4999   °[facility]    (336) 373-0678   °[facility]    (336) 272-1050   °[facility] ° [address], [place] Phone:  (336) 832-4444, Fax:  (336) 832-4440 Hours of Operation:  9 am - 6 pm, M-F.  Also accepts Medicaid/Medicare and self-pay.  °[facility] ° [address], [place] Phone: (336) 832-3150, Fax: (336) 832-3151. Hours of Operation:  8:30 am - 5:30 pm, M-F.  Also accepts Medicaid and self-pay.  °[facility] [place] [address], [place] Phone: (336) 878-6027   °[facility] [address], [place], [place] (336)723-1848, Ext. 123 Mondays & Thursdays: 7-9 AM.  First 15 patients are seen on a first come, first serve basis. °  ° °Medicaid-accepting [place] Providers: ° °Organization         Address  Phone   Notes  °[facility] [address], [place] (336) 641-2100 Also accepts self-pay patients.  °[facility] [address], [place] ° (336) 856-9996   °[facility] [address], [place] (336) 288-8857   °[facility] [address] [place] [address], [place] (336) 299-7000   °Veita Bland [address], [place]  ° (336) 373-1557 Only accepts [place] Access Medicaid patients after they have their name applied to their card.  ° °Self-Pay (no insurance) in [place]: ° °Organization         Address  Phone   Notes  °Sickle Cell Patients, [facility] [address], [place] (336)  832-1970   °[facility] [address], [place] (336) 832-4400   °[facility] ° [address], [place] (336) 992-4800   °[facility]/Dr. Osei-Bonsu ° [address] [place] [address], [place] or [address], [place] (336) 841-8500 Phone number for both [place] and [place] locations is the same.  °[facility] [address], [place] (336) 299-0000   °[facility] [address] [place] [address], [place] or [address] (336) 852-7530 °(336) 878-2260   °[facility] [address],  (336) 350-1642, phone; (336) 294-5005, fax Sees patients 1st and 3rd Saturday of every month.  Must not qualify for public or private insurance (i.e. Medicaid, Medicare,  Health Choice, Veterans' Benefits) • Household income should be no more than 200% of the poverty level •The clinic cannot treat you if you are pregnant or think you   are pregnant • Sexually transmitted diseases are not treated at the clinic.  ° ° °Dental Care: °Organization         Address  Phone  Notes  °[facility] [facility] [address], [place] (336) 641-6152 Accepts children up to age 21 who are enrolled in Medicaid or [place] Health Choice; pregnant women with a Medicaid card; and children who have applied for Medicaid or [place] Health Choice, but were declined, whose parents can pay a reduced fee at time of service.  °[facility] [place]  [address], [place] (336) 641-7733 Accepts children up to age 21 who are enrolled in Medicaid or [place] Health Choice; pregnant women with a Medicaid card; and children who have applied for Medicaid or [place] Health Choice, but were declined, whose parents can pay a reduced fee at time of service.  °[facility] ° [address], [place] (336) 641-4533 Patients are  seen by appointment only. Walk-ins are not accepted. [facility] will see patients 18 years of age and older. °Monday - Tuesday (8am-5pm) °Most Wednesdays (8:30-5pm) °$30 per visit, cash only  °[facility] ° [address], [place] (336) 641-4533 Patients are seen by appointment only. Walk-ins are not accepted. [facility] will see patients 18 years of age and older. °One Wednesday Evening (Monthly: Volunteer Based).  $30 per visit, cash only  °[facility]  (919) 537-3737 for adults; Children under age 4, call Graduate Pediatric Dentistry at ([phone]) [phone]. Children aged 4-14, please call ([phone]) [phone] to request a pediatric application. ° Dental services are provided in all areas of dental care including fillings, crowns and bridges, complete and partial dentures, implants, gum treatment, root canals, and extractions. Preventive care is also provided. Treatment is provided to both adults and children. °Patients are selected via a lottery and there is often a waiting list. °  °[facility] [address], °[place] ° (336) 763-8833 [email or web address] °  °[facility] [address], [place], [place] (336)723-1848, Ext. 123 Second and Fourth Thursday of each month, opens at 6:30 AM; Clinic ends at 9 AM.  Patients are seen on a first-come first-served basis, and a limited number are seen during each clinic.  ° °[facility] ° [address], [place], [place] (336) 723-7904   Eligibility Requirements °You must have lived in [place], [place], or [place] counties for at least the last three months. °  You cannot be eligible for state or federal sponsored healthcare insurance, including Veterans Administration, Medicaid, or Medicare. °  You generally cannot be eligible for healthcare insurance through your employer.  °  How to apply: °Eligibility screenings are held every Tuesday and Wednesday afternoon from 1:00 pm until 4:00  pm. You do not need an appointment for the interview!  °[facility] [address], [place], [place] 336-631-2330   °[facility]  336-342-8273   °[facility]  336-703-3100   °[facility]  336-570-6415   ° °Behavioral Health Resources in the Community: °Intensive Outpatient Programs °Organization         Address  Phone  Notes  °[facility] [address], [place], [place] 336-878-6098   °[facility] [address], , [place] 336-832-9800   °[facility] [address], [place], [place] ° 336-882-2125   °[facility] [address],  °[place], [place] 1-800-853-5163 or 336-641-4981   °Substance Abuse Resources °Organization         Address  Phone  Notes  °[facility]  336-882-2125   °[facility]  336-784-9470   °[facility]  336-285-9073   °[facility]  336-845-3988   °Residential & Outpatient Substance Abuse Program  1-800-659-3381   °Psychological Services °Organization         Address  Phone  Notes  °[facility]  336- 832-9600   °[facility]  336- 378-7881   °[facility] [address], [place] 1-800-853-5163 or 336-641-4981   ° °Mobile Crisis Teams °Organization         Address  Phone  Notes  °[facility], Mobile Crisis Care Unit  1-877-626-1772   °Assertive °[facility] ° [address] [place], [place] 336-834-9664   °Sharon DeEsch [address] °[place] 336-554-5454   ° °Self-Help/Support Groups °Organization         Address  Phone             Notes  °[facility] - variety of support groups  336- 373-1402 Call for more information  °Narcotics Anonymous (NA), [facility] [address], °[place]  2 meetings at this location  ° °Residential Treatment Programs °Organization          Address  Phone  Notes  °[facility] [address],    °[place]  1-866-801-8205   °[facility] ° [address], [place], [place] 704-293-8524   °[facility] [address], [place] 336-845-3988 Admissions: 8am-3pm M-F  °[facility] [address]    °[place], [place] 336-841-1104   °[facility] [address], [place], [place] 336-379-7146   °[facility] [address]  °[place], [place] 336-285-9073   °[facility] - Intensive Outpatient [address], [place], [place] 336-852-3033   °[facility] [address]  °[place], [place] 1-877-615-2722 or 336-784-9470   °[facility] [address] [place], [place] 336-227-7417 Accepts Medicaid  °[facility] [address]  ° [place] 1-800-659-3381 Substance Abuse/Addiction Treatment  ° °[place] Behavioral Health Resources °Organization         Address  Phone  Notes  °[facility]  (888) 581-9988   °Julie Brannon, PhD [address] [place], [place]   (336) 349-5553 or (336) 951-0000   ° Behavioral   [address] °[place], [place] (336) 349-4454   °[facility] [address], [place], [place] (336) 342-8316 Insurance/Medicaid/sponsorship through [facility]  °[facility] [address]                                    [place], [place] (336) 342-8316 Therapy/tele-psych/case  °[facility] [address]  ° [place], [place] (336) 349-2233    °Dr. Arfeen  (336) 349-4544   °[facility] [place]  [employer] [place] [facility] 1) [address],  °2) [address], [place] °3)  [address], [place] (336) 349-3220 °(336) 342-7768 ° °(336) 342-8140   °[place] [facility] (336)   342-1394 or (336) 342-3537 (After Hours)    ° ° ° °

## 2015-01-27 NOTE — ED Notes (Addendum)
Patient presents to ED for alcohol detox. Patient sts had never been through a detox program before and denies SI/HI. Patient sts he occasionally uses recreational drugs limited to cocaine and marijuana. Patient endorses cocaine use 1-2x a month when around friends who are doing it. Patient endorses weekly marijuana usage. Patient states started drinking at age 29, off and on heavily. Patient sts starts to drink heavily during difficult situations in life as coping mechanism, which he acknowledges at this tim is not appropriate. Patient states he drinks 12-18 beers a day, with last drink at 2:30am today.   Pt and wife amiable, and agreeable to assessment and plan of care. Pt. In NAD, denies needs at present time.

## 2015-10-14 DIAGNOSIS — F17210 Nicotine dependence, cigarettes, uncomplicated: Secondary | ICD-10-CM | POA: Diagnosis not present

## 2015-10-14 DIAGNOSIS — R1033 Periumbilical pain: Secondary | ICD-10-CM | POA: Diagnosis not present

## 2015-10-14 DIAGNOSIS — Z79899 Other long term (current) drug therapy: Secondary | ICD-10-CM | POA: Diagnosis not present

## 2015-10-14 DIAGNOSIS — I10 Essential (primary) hypertension: Secondary | ICD-10-CM | POA: Diagnosis not present

## 2015-10-14 DIAGNOSIS — F419 Anxiety disorder, unspecified: Secondary | ICD-10-CM | POA: Diagnosis not present

## 2015-10-14 DIAGNOSIS — F988 Other specified behavioral and emotional disorders with onset usually occurring in childhood and adolescence: Secondary | ICD-10-CM | POA: Diagnosis not present

## 2015-12-10 ENCOUNTER — Encounter: Payer: Self-pay | Admitting: Osteopathic Medicine

## 2015-12-10 ENCOUNTER — Ambulatory Visit (INDEPENDENT_AMBULATORY_CARE_PROVIDER_SITE_OTHER): Payer: 59 | Admitting: Osteopathic Medicine

## 2015-12-10 VITALS — BP 152/108 | HR 121 | Wt 284.0 lb

## 2015-12-10 DIAGNOSIS — S39012A Strain of muscle, fascia and tendon of lower back, initial encounter: Secondary | ICD-10-CM

## 2015-12-10 DIAGNOSIS — F10288 Alcohol dependence with other alcohol-induced disorder: Secondary | ICD-10-CM

## 2015-12-10 DIAGNOSIS — IMO0001 Reserved for inherently not codable concepts without codable children: Secondary | ICD-10-CM

## 2015-12-10 DIAGNOSIS — R030 Elevated blood-pressure reading, without diagnosis of hypertension: Secondary | ICD-10-CM

## 2015-12-10 DIAGNOSIS — F411 Generalized anxiety disorder: Secondary | ICD-10-CM | POA: Diagnosis not present

## 2015-12-10 DIAGNOSIS — F332 Major depressive disorder, recurrent severe without psychotic features: Secondary | ICD-10-CM | POA: Insufficient documentation

## 2015-12-10 DIAGNOSIS — F39 Unspecified mood [affective] disorder: Secondary | ICD-10-CM | POA: Diagnosis not present

## 2015-12-10 DIAGNOSIS — F1921 Other psychoactive substance dependence, in remission: Secondary | ICD-10-CM | POA: Diagnosis not present

## 2015-12-10 DIAGNOSIS — F1020 Alcohol dependence, uncomplicated: Secondary | ICD-10-CM | POA: Insufficient documentation

## 2015-12-10 MED ORDER — OLANZAPINE-FLUOXETINE HCL 6-25 MG PO CAPS: 1.0000 | ORAL_CAPSULE | Freq: Every day | ORAL | Status: AC

## 2015-12-10 MED ORDER — CYCLOBENZAPRINE HCL 10 MG PO TABS: ORAL_TABLET | ORAL | Status: AC

## 2015-12-10 MED ORDER — HYDROXYZINE HCL 50 MG PO TABS: 50.0000 mg | ORAL_TABLET | Freq: Three times a day (TID) | ORAL | Status: AC | PRN

## 2015-12-10 MED ORDER — MELOXICAM 7.5 MG PO TABS: 7.5000 mg | ORAL_TABLET | Freq: Every day | ORAL | Status: AC

## 2015-12-10 NOTE — Patient Instructions (Addendum)
Plan for today #1 get lab work done down stairs #2 get x-ray of the back done downstairs #3 pick up medications at pharmacy, you will have for prescriptions, an anti-inflammatory and a muscle relaxer for your back, and anxiety medication and another medication for anxiety/bipolar #4 you should hear back from physical therapy about scheduling an appointment, please let us know if you don't #5 he should hear back from the psychiatrist about scheduling an appointment, please let us know if you don't, or you can visit their office downstairs to schedule an appointment while you are here #6 see below for further information, pay particular attention to reasons to go to emergency room for alcohol withdrawals #7 plan to follow-up with Dr. Lyn Hollingshead in the next 2 weeks for follow-up on blood pressure and recheck symptoms.    We have placed referrals for psychiatry for further evaluation, but in the meantime have started a medication which can help with anxiety/depression issues and mood disorder problems. If you are having any issues with the medication, please let us know right away. If you have any thoughts of hurting himself or others, call 911 or present to the nearest emergency department or to the [facility] in [place]. You have also been given information for facility called [facility] in [facility] which deals with acute psychiatric issues and can do mental health screenings the same day. You're encouraged to call them with any questions or you can also visit their facility. At this time, I do not think it is safe for you to be on controlled substances such as Klonopin due to the risk of dependence/addiction. I prescribed an alternative antianxiety medication to take as needed. We have also ordered a urine drug screen today, particularly since certain drugs can exacerbate symptoms of anxiety/depression, we need to know exactly what is in your system. Do not take  medications that have not been prescribed for you. [facility]: [address] [place], [place]. [phone]  For back pain, we have place referral to physical therapy and we are getting an x-ray done today. You have also been given prescriptions for strong anti-inflammatory medications as well as muscle relaxers. If you experience numbness/weakness, particularly numbness between the legs with urinary accidents, this is a medical emergency and you need to go to an emergency room right away. Otherwise, let's plan to follow this up, if no improvement with medications and physical therapy at that point would pursue MRI for further evaluation and would consider referral to pain management specialist for injections. If you need reduced work duties, please provide [place] with FMLA forms and we will work on completing these for you.  Please make sure that you have signed a release form for [place] to get records from your previous physician.  Please let us know if there is anything else we can do for you or if you have any other questions or concerns!      Alcohol Withdrawal Alcohol withdrawal is a group of symptoms that can develop when a person who drinks heavily and regularly stops drinking or drinks less. CAUSES Heavy and regular drinking can cause chemicals that send signals from the brain to the body (neurotransmitters) to deactivate. Alcohol withdrawal develops when deactivated neurotransmitters reactivate because a person stops drinking or drinks less. RISK FACTORS The more a person drinks and the longer he or she drinks, the greater the risk of alcohol withdrawal. Severe withdrawal is more likely to develop in someone who:  Had severe alcohol withdrawal in the past.  Had a seizure during a previous episode of alcohol withdrawal.  Is elderly.  Is pregnant.  Has been abusing drugs.  Has other medical problems, including:  Infection.  Heart, lung, or liver  disease.  Seizures.  Mental health problems. SYMPTOMS Symptoms of this condition can be mild to moderate, or they can be severe. Mild to moderate symptoms may include:  Fatigue.  Nightmares.  Trouble sleeping.  Depression.  Anxiety.  Inability to think clearly.  Mood swings.  Irritability.  Loss of appetite.  Nausea or vomiting.  Clammy skin.  Extreme sweating.  Rapid heartbeat.  Shakiness.  Uncontrollable shaking (tremor). Severe symptoms may include:  Fever.  Seizures.  Severeconfusion.  Feeling or seeing things that are not there (hallucinations). Symptoms usually begin within eight hours after a person stops drinking or drinks less. They can last for weeks. DIAGNOSIS Alcohol withdrawal is diagnosed with a medical history and physical exam. Sometimes, urine and blood tests are also done. TREATMENT Treatment may involve:  Monitoring blood pressure, pulse, and breathing.  Getting fluids through an IV tube.  Medicine to reduce anxiety.  Medicine to prevent or control seizures.  Multivitamins and B vitamins.  Having a health care provider check on you daily. If symptoms are moderate to severe or if there is a risk of severe withdrawal, treatment may be done at a hospital or treatment center. HOME CARE INSTRUCTIONS  Take medicines and vitamin supplements only as directed by your health care provider.  Do not drink alcohol.  Have someone stay with you or be available if you need help.  Drink enough fluid to keep your urine clear or pale yellow.  Consider joining a 12-step program or another alcohol support group. SEEK MEDICAL CARE IF:  Your symptoms get worse or do not go away.  You cannot keep food or water in your stomach.  You are struggling with not drinking alcohol.  You cannot stop drinking alcohol. SEEK IMMEDIATE MEDICAL CARE IF:   You have an irregular heartbeat.  You have chest pain.  You have trouble breathing.  You  have symptoms of severe withdrawal, such as:  A fever.  Seizures.  Severe confusion.  Hallucinations.   This information is not intended to replace advice given to you by your health care provider. Make sure you discuss any questions you have with your health care provider.   Document Released: 05/20/2005 Document Revised: 08/31/2014 Document Reviewed: 05/29/2014 Elsevier Interactive Patient Education [employer]2016 Elsevier Inc.
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
## 2015-12-24 ENCOUNTER — Ambulatory Visit (INDEPENDENT_AMBULATORY_CARE_PROVIDER_SITE_OTHER): Payer: 59 | Admitting: Family Medicine

## 2015-12-24 DIAGNOSIS — Z5329 Procedure and treatment not carried out because of patient's decision for other reasons: Secondary | ICD-10-CM

## 2015-12-25 NOTE — Progress Notes (Signed)
No show return PRN

## 2015-12-31 ENCOUNTER — Emergency Department (HOSPITAL_COMMUNITY)
Admission: EM | Admit: 2015-12-31 | Discharge: 2015-12-31 | Disposition: A | Discharge status: Home / Self Care | Payer: 59 | Attending: Emergency Medicine | Admitting: Emergency Medicine

## 2015-12-31 ENCOUNTER — Encounter (HOSPITAL_COMMUNITY): Payer: Self-pay

## 2015-12-31 DIAGNOSIS — R000 Tachycardia, unspecified: Secondary | ICD-10-CM | POA: Diagnosis not present

## 2015-12-31 DIAGNOSIS — F1010 Alcohol abuse, uncomplicated: Secondary | ICD-10-CM | POA: Diagnosis not present

## 2015-12-31 DIAGNOSIS — Z79899 Other long term (current) drug therapy: Secondary | ICD-10-CM | POA: Insufficient documentation

## 2015-12-31 DIAGNOSIS — F1410 Cocaine abuse, uncomplicated: Secondary | ICD-10-CM | POA: Diagnosis not present

## 2015-12-31 DIAGNOSIS — F17210 Nicotine dependence, cigarettes, uncomplicated: Secondary | ICD-10-CM | POA: Insufficient documentation

## 2015-12-31 DIAGNOSIS — R079 Chest pain, unspecified: Secondary | ICD-10-CM | POA: Diagnosis present

## 2015-12-31 DIAGNOSIS — F1510 Other stimulant abuse, uncomplicated: Secondary | ICD-10-CM | POA: Insufficient documentation

## 2015-12-31 LAB — CBC
HCT: 46.3 % (ref 39.0–52.0)
HEMOGLOBIN: 16.2 g/dL (ref 13.0–17.0)
MCH: 30.5 pg (ref 26.0–34.0)
MCHC: 35 g/dL (ref 30.0–36.0)
MCV: 87.2 fL (ref 78.0–100.0)
Platelets: 256 10*3/uL (ref 150–400)
RBC: 5.31 MIL/uL (ref 4.22–5.81)
RDW: 13.6 % (ref 11.5–15.5)
WBC: 9 10*3/uL (ref 4.0–10.5)

## 2015-12-31 LAB — I-STAT CHEM 8, ED
BUN: 18 mg/dL (ref 6–20)
CALCIUM ION: 1.17 mmol/L (ref 1.12–1.23)
CHLORIDE: 103 mmol/L (ref 101–111)
Creatinine, Ser: 0.8 mg/dL (ref 0.61–1.24)
Glucose, Bld: 98 mg/dL (ref 65–99)
HEMATOCRIT: 50 % (ref 39.0–52.0)
Hemoglobin: 17 g/dL (ref 13.0–17.0)
POTASSIUM: 4 mmol/L (ref 3.5–5.1)
SODIUM: 139 mmol/L (ref 135–145)
TCO2: 25 mmol/L (ref 0–100)

## 2015-12-31 LAB — I-STAT TROPONIN, ED: TROPONIN I, POC: 0 ng/mL (ref 0.00–0.08)

## 2015-12-31 NOTE — ED Notes (Signed)
Bed: WA01 Expected date:  Expected time:  Means of arrival:  Comments: 

## 2015-12-31 NOTE — Discharge Instructions (Signed)
Polysubstance Abuse °When people abuse more than one drug or type of drug it is called polysubstance or polydrug abuse. For example, many smokers also drink alcohol. This is one form of polydrug abuse. Polydrug abuse also refers to the use of a drug to counteract an unpleasant effect produced by another drug. It may also be used to help with withdrawal from another drug. People who take stimulants may become agitated. Sometimes this agitation is countered with a tranquilizer. This helps protect against the unpleasant side effects. Polydrug abuse also refers to the use of different drugs at the same time.  °Anytime drug use is interfering with normal living activities, it has become abuse. This includes problems with family and friends. Psychological dependence has developed when your mind tells you that the drug is needed. This is usually followed by physical dependence which has developed when continuing increases of drug are required to get the same feeling or "high". This is known as addiction or chemical dependency. A person's risk is much higher if there is a history of chemical dependency in the family. °SIGNS OF CHEMICAL DEPENDENCY °· You have been told by friends or family that drugs have become a problem. °· You fight when using drugs. °· You are having blackouts (not remembering what you do while using). °· You feel sick from using drugs but continue using. °· You lie about use or amounts of drugs (chemicals) used. °· You need chemicals to get you going. °· You are suffering in work performance or in school because of drug use. °· You get sick from use of drugs but continue to use anyway. °· You need drugs to relate to people or feel comfortable in social situations. °· You use drugs to forget problems. °"Yes" answered to any of the above signs of chemical dependency indicates there are problems. The longer the use of drugs continues, the greater the problems will become. °If there is a family history of  drug or alcohol use, it is best not to experiment with these drugs. Continual use leads to tolerance. After tolerance develops more of the drug is needed to get the same feeling. This is followed by addiction. With addiction, drugs become the most important part of life. It becomes more important to take drugs than participate in the other usual activities of life. This includes relating to friends and family. Addiction is followed by dependency. Dependency is a condition where drugs are now needed not just to get high, but to feel normal. °Addiction cannot be cured but it can be stopped. This often requires outside help and the care of professionals. Treatment centers are listed in the yellow pages under: Cocaine, Narcotics, and Alcoholics Anonymous. Most hospitals and clinics can refer you to a specialized care center. Talk to your caregiver if you need help. °  °This information is not intended to replace advice given to you by your health care provider. Make sure you discuss any questions you have with your health care provider. °  °Document Released: 04/01/2005 Document Revised: 11/02/2011 Document Reviewed: 08/15/2014 °Elsevier Interactive Patient Education ©2016 Elsevier Inc. ° °

## 2015-12-31 NOTE — ED Notes (Signed)
20 gauge in left hand per EMS

## 2015-12-31 NOTE — ED Notes (Signed)
Pt is taking hydroxycut and then drank a Monster 30 minutes prior to EMS getting there, he states that it feels lie his heart is about to come out of his chest and his heart is fluttering

## 2015-12-31 NOTE — ED Provider Notes (Signed)
CSN: [id number]     Arrival date & time 12/31/15  0600 History   First MD Initiated Contact with Patient 12/31/15 0815     Chief Complaint  Patient presents with  . Chest Pain      HPI Patient presents with chest pain.  Also admits to cocaine abuse.  Pain is now gone.  Note diaphoresis and some nausea. History reviewed. No pertinent past medical history. Past Surgical History  Procedure Laterality Date  . Ankle surgery    . Pelvic fracture surgery     Family History  Problem Relation Age of Onset  . Depression Mother   . Hypertension Father   . Alcohol abuse Father   . Stroke Maternal Aunt   . Depression Maternal Aunt   . Alcohol abuse Paternal Grandfather   . Heart attack Father    Social History  Substance Use Topics  . Smoking status: Current Every Day Smoker -- 0.50 packs/day    Types: Cigarettes  . Smokeless tobacco: Former NeurosurgeonUser  . Alcohol Use: 3.6 oz/week    6 Cans of beer per week     Comment: 12-18 beers a day.    Review of Systems  Constitutional: Negative for fever.  All other systems reviewed and are negative.     Allergies  Review of patient's allergies indicates no known allergies.  Home Medications   Prior to Admission medications   Medication Sig Start Date End Date Taking? Authorizing Provider  clonazePAM (KLONOPIN) 2 MG tablet Take 2 mg by mouth daily as needed for anxiety.    Yes Historical Provider, MD  TRAZODONE HCL PO Take 1 tablet by mouth at bedtime as needed (sleep).   Yes Historical Provider, MD  cyclobenzaprine (FLEXERIL) 10 MG tablet One half tab PO qHS, then increase gradually to one tab TID. Patient not taking: Reported on 12/31/2015 12/10/15   Sunnie NielsenNatalie Alexander, DO  hydrOXYzine (ATARAX/VISTARIL) 50 MG tablet Take 1 tablet (50 mg total) by mouth 3 (three) times daily as needed for anxiety. Patient not taking: Reported on 12/31/2015 12/10/15   Sunnie NielsenNatalie Alexander, DO  meloxicam (MOBIC) 7.5 MG tablet Take 1 tablet (7.5 mg total) by mouth  daily. Patient not taking: Reported on 12/31/2015 12/10/15   Sunnie NielsenNatalie Alexander, DO  OLANZapine-FLUoxetine [facility](SYMBYAX) 6-25 MG capsule Take 1 capsule by mouth at bedtime. Patient not taking: Reported on 12/31/2015 12/10/15   Sunnie NielsenNatalie Alexander, DO   BP 139/85 mmHg  Pulse 83  Temp(Src) 97.7 F (36.5 C) (Oral)  Resp 23  Ht 5\' 11"  (1.803 m)  Wt 270 lb (122.471 kg)  BMI 37.67 kg/m2  SpO2 94% Physical Exam  Constitutional: He is oriented to person, place, and time. He appears well-developed and well-nourished. No distress.  HENT:  Head: Normocephalic and atraumatic.  Eyes: Pupils are equal, round, and reactive to light.  Neck: Normal range of motion.  Cardiovascular: Normal rate and intact distal pulses.   Pulmonary/Chest: No respiratory distress.  Abdominal: Normal appearance. He exhibits no distension.  Musculoskeletal: Normal range of motion.  Neurological: He is alert and oriented to person, place, and time. No cranial nerve deficit.  Skin: Skin is warm and dry. No rash noted.  Psychiatric: He has a normal mood and affect. His behavior is normal.  Nursing note and vitals reviewed.   ED Course  Procedures (including critical care time) Labs Review Labs Reviewed  CBC  I-STAT CHEM 8, ED  I-STAT TROPOININ, ED    Imaging Review No results found. I have personally reviewed  and evaluated these images and lab results as part of my medical decision-making.   EKG Interpretation   Date/Time:  Tuesday Dec 31 2015 06:07:57 EDT Ventricular Rate:  109 PR Interval:  132 QRS Duration: 91 QT Interval:  315 QTC Calculation: 424 R Axis:   105 Text Interpretation:  Sinus tachycardia Borderline right axis deviation ST  elev, probable normal early repol pattern Baseline wander in lead(s) I III  aVL No previous tracing Abnormal ekg Confirmed by Karen Kinnard  MD, Verdie Wilms  ([id number]) on 12/31/2015 8:16:14 AM      MDM   Final diagnoses:  Cocaine abuse, episodic use  Caffeine abuse        Nelva Nay, MD 12/31/15 1104

## 2016-03-17 DIAGNOSIS — S61412D Laceration without foreign body of left hand, subsequent encounter: Secondary | ICD-10-CM | POA: Diagnosis not present

## 2016-04-13 DIAGNOSIS — F17210 Nicotine dependence, cigarettes, uncomplicated: Secondary | ICD-10-CM | POA: Diagnosis not present

## 2016-04-13 DIAGNOSIS — Z79899 Other long term (current) drug therapy: Secondary | ICD-10-CM | POA: Diagnosis not present

## 2016-04-13 DIAGNOSIS — M25512 Pain in left shoulder: Secondary | ICD-10-CM | POA: Diagnosis not present

## 2016-04-13 DIAGNOSIS — S161XXA Strain of muscle, fascia and tendon at neck level, initial encounter: Secondary | ICD-10-CM | POA: Diagnosis not present

## 2016-04-13 DIAGNOSIS — R202 Paresthesia of skin: Secondary | ICD-10-CM | POA: Diagnosis not present

## 2016-04-13 DIAGNOSIS — X58XXXA Exposure to other specified factors, initial encounter: Secondary | ICD-10-CM | POA: Diagnosis not present

## 2016-04-13 DIAGNOSIS — I10 Essential (primary) hypertension: Secondary | ICD-10-CM | POA: Diagnosis not present

## 2016-04-15 DIAGNOSIS — Z79899 Other long term (current) drug therapy: Secondary | ICD-10-CM | POA: Diagnosis not present

## 2016-04-15 DIAGNOSIS — F17210 Nicotine dependence, cigarettes, uncomplicated: Secondary | ICD-10-CM | POA: Diagnosis not present

## 2016-04-15 DIAGNOSIS — K760 Fatty (change of) liver, not elsewhere classified: Secondary | ICD-10-CM | POA: Diagnosis not present

## 2016-04-15 DIAGNOSIS — I10 Essential (primary) hypertension: Secondary | ICD-10-CM | POA: Diagnosis not present

## 2016-04-15 DIAGNOSIS — R1110 Vomiting, unspecified: Secondary | ICD-10-CM | POA: Diagnosis not present

## 2016-04-15 DIAGNOSIS — R7989 Other specified abnormal findings of blood chemistry: Secondary | ICD-10-CM | POA: Diagnosis not present

## 2016-04-15 DIAGNOSIS — R05 Cough: Secondary | ICD-10-CM | POA: Diagnosis not present

## 2016-04-17 DIAGNOSIS — S0501XA Injury of conjunctiva and corneal abrasion without foreign body, right eye, initial encounter: Secondary | ICD-10-CM | POA: Diagnosis not present

## 2016-04-21 DIAGNOSIS — I10 Essential (primary) hypertension: Secondary | ICD-10-CM | POA: Diagnosis not present

## 2016-04-21 DIAGNOSIS — S0501XD Injury of conjunctiva and corneal abrasion without foreign body, right eye, subsequent encounter: Secondary | ICD-10-CM | POA: Diagnosis not present

## 2016-10-07 DIAGNOSIS — I469 Cardiac arrest, cause unspecified: Secondary | ICD-10-CM | POA: Diagnosis not present

## 2016-10-07 DIAGNOSIS — Z452 Encounter for adjustment and management of vascular access device: Secondary | ICD-10-CM | POA: Diagnosis not present

## 2016-10-07 DIAGNOSIS — R918 Other nonspecific abnormal finding of lung field: Secondary | ICD-10-CM | POA: Diagnosis not present

## 2016-10-07 DIAGNOSIS — Z4682 Encounter for fitting and adjustment of non-vascular catheter: Secondary | ICD-10-CM | POA: Diagnosis not present

## 2016-10-07 DIAGNOSIS — I10 Essential (primary) hypertension: Secondary | ICD-10-CM | POA: Diagnosis not present

## 2016-10-07 DIAGNOSIS — T402X1A Poisoning by other opioids, accidental (unintentional), initial encounter: Secondary | ICD-10-CM | POA: Diagnosis not present

## 2016-10-07 DIAGNOSIS — J329 Chronic sinusitis, unspecified: Secondary | ICD-10-CM | POA: Diagnosis not present

## 2016-10-07 DIAGNOSIS — R092 Respiratory arrest: Secondary | ICD-10-CM | POA: Diagnosis not present

## 2016-10-08 DIAGNOSIS — Z4682 Encounter for fitting and adjustment of non-vascular catheter: Secondary | ICD-10-CM | POA: Diagnosis not present

## 2016-10-08 DIAGNOSIS — R0602 Shortness of breath: Secondary | ICD-10-CM | POA: Diagnosis not present

## 2016-10-22 DEATH — deceased

## 2016-12-22 IMAGING — CR DG ANKLE COMPLETE 3+V*L*
3 series · 3 of 3 positions shown · non-contrast
Comparison: None.

CLINICAL DATA: Fall with left ankle injury

EXAM:
LEFT ANKLE COMPLETE - 3+ VIEW

[t ankle joint ap left]
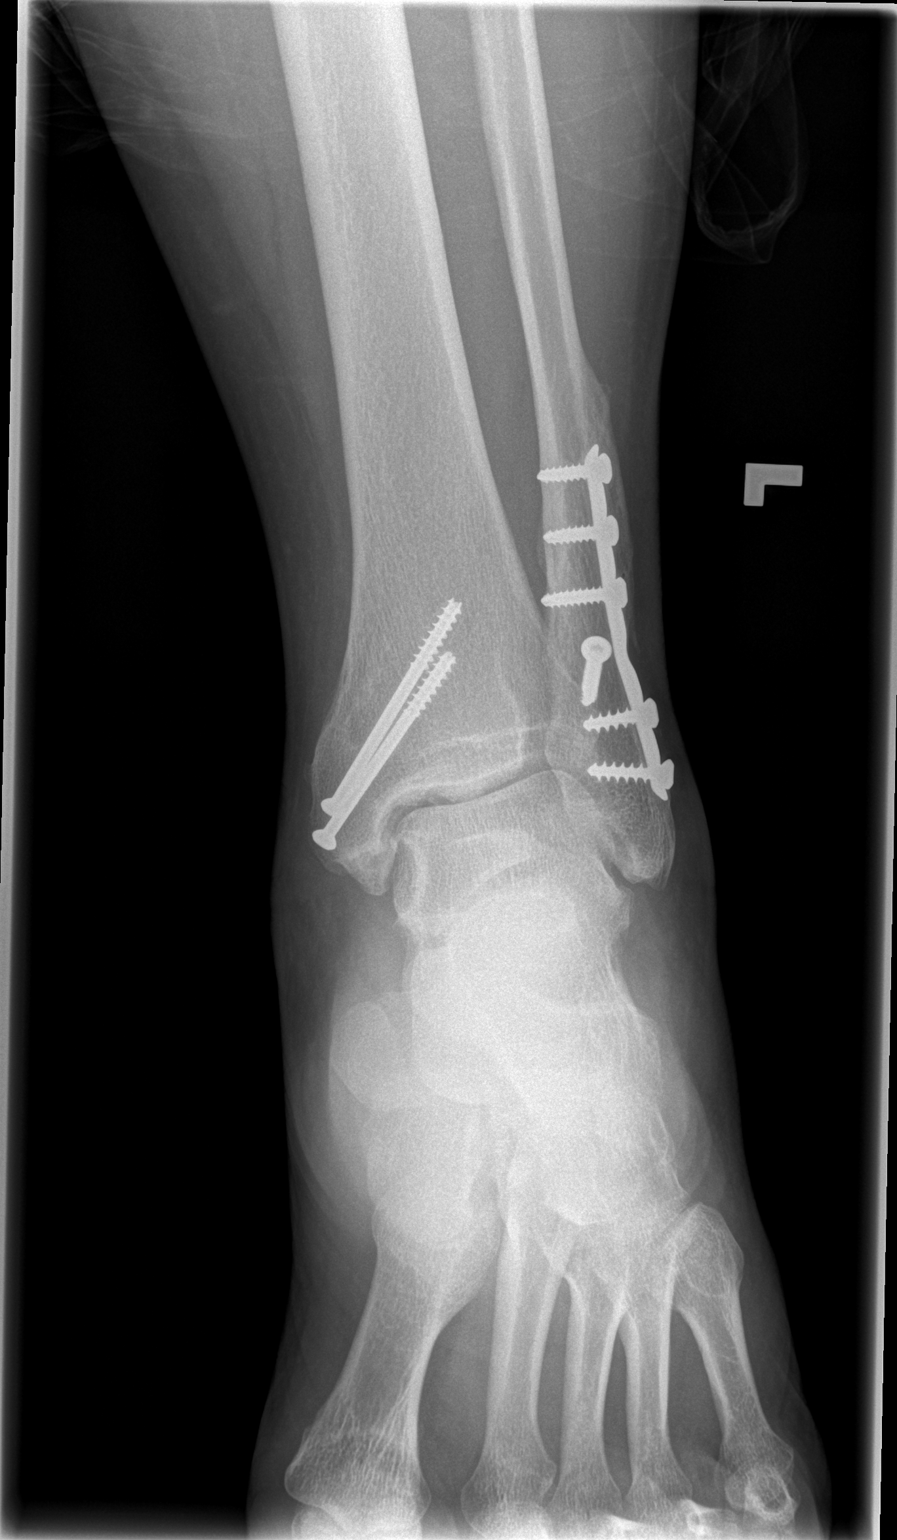

[t ankle joint oblique left]
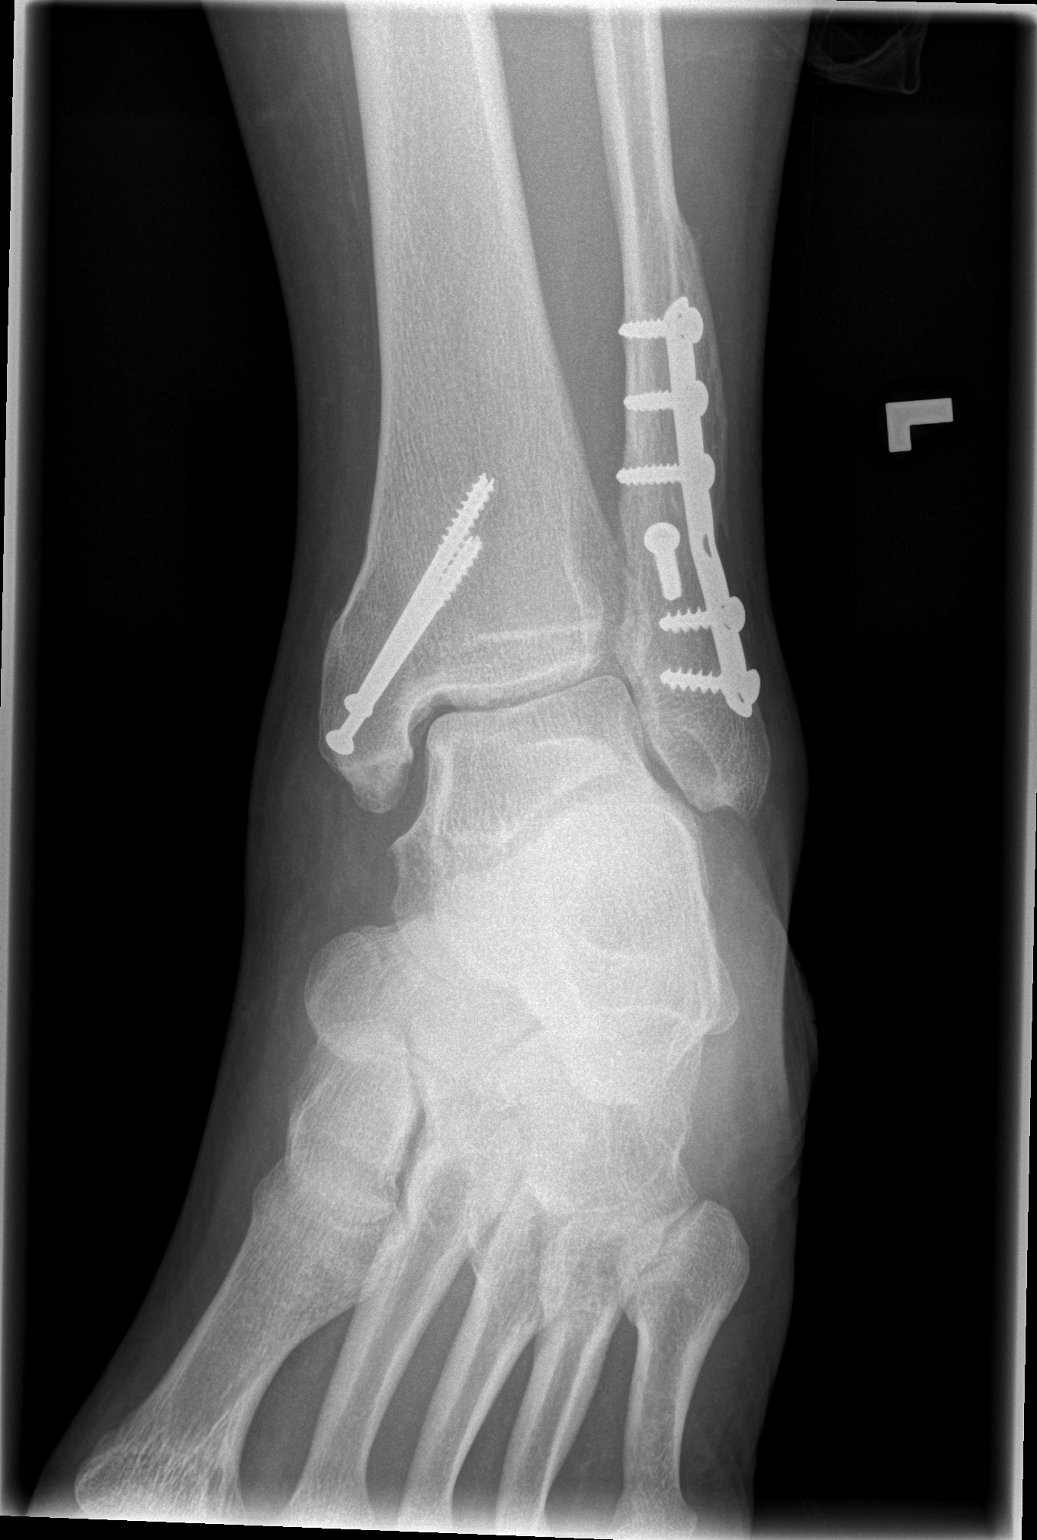

[t ankle joint lat left]
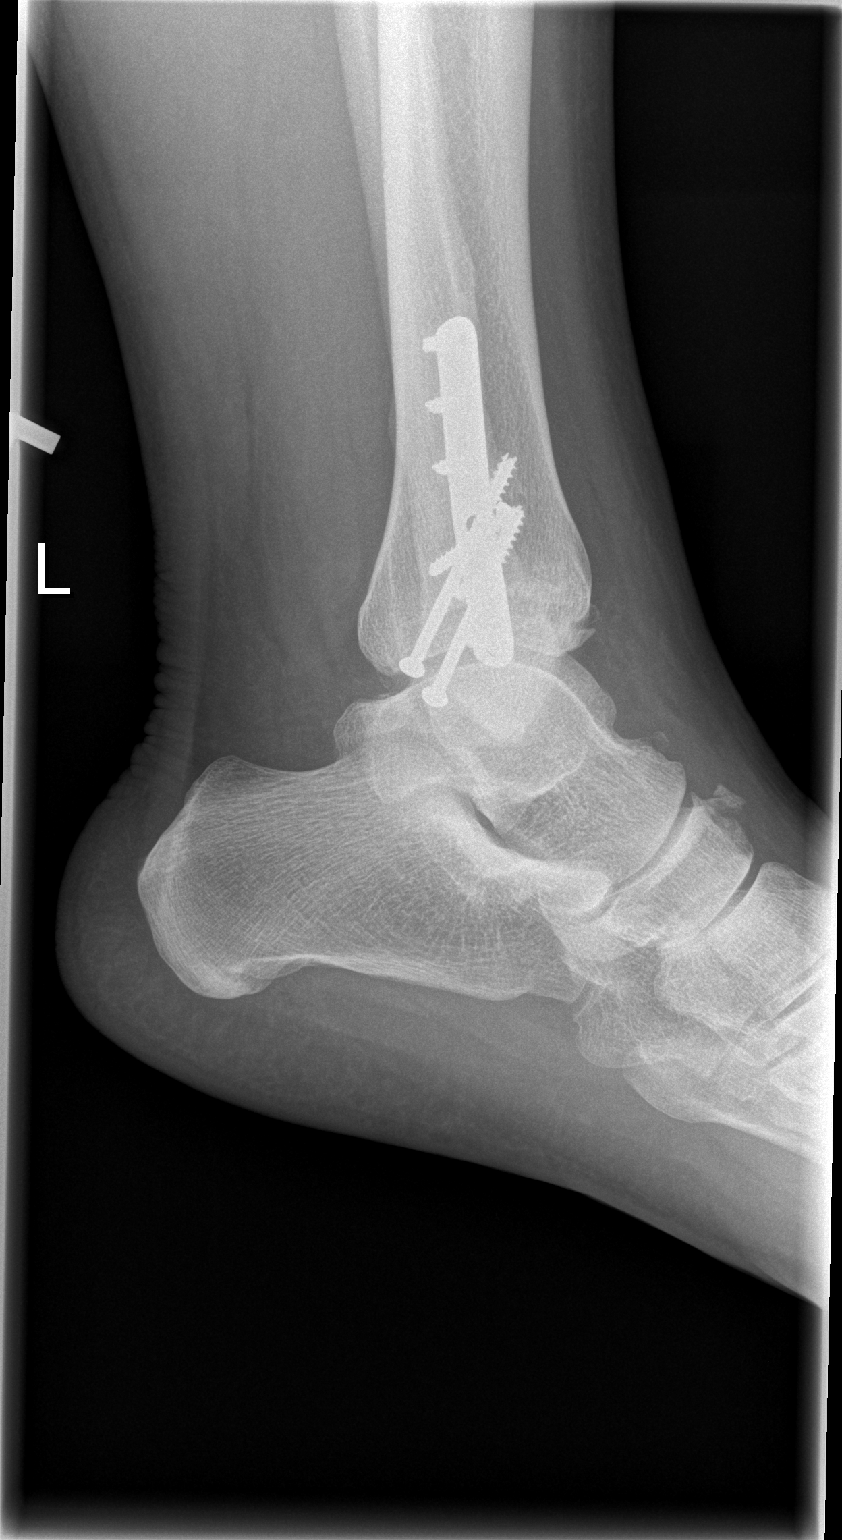

[3 of 3 positions shown; findings below may reference images not displayed]

FINDINGS: There is no evidence of fracture, dislocation, or joint effusion.

Remote bimalleolar fracture ORIF.  No adverse findings.

Chronic appearing ossification dorsal to the talar neck and
navicular.
IMPRESSION: No acute osseous findings.
